# Patient Record
Sex: Female | Born: 1975 | Race: Black or African American | Hispanic: No | Marital: Single | Smoking: Never smoker
Health system: Southern US, Community
[De-identification: ages and names within clinical notes are randomized; demographics above are authoritative.]

## PROBLEM LIST (undated history)

## (undated) DIAGNOSIS — R7303 Prediabetes: Secondary | ICD-10-CM

## (undated) DIAGNOSIS — T8859XA Other complications of anesthesia, initial encounter: Secondary | ICD-10-CM

## (undated) DIAGNOSIS — H9190 Unspecified hearing loss, unspecified ear: Secondary | ICD-10-CM

## (undated) HISTORY — DX: Unspecified hearing loss, unspecified ear: H91.90

## (undated) HISTORY — DX: Prediabetes: R73.03

---

## 1999-08-13 ENCOUNTER — Other Ambulatory Visit: Admission: RE | Admit: 1999-08-13 | Discharge: 1999-08-13 | Payer: Self-pay | Admitting: Obstetrics & Gynecology

## 2001-04-23 ENCOUNTER — Other Ambulatory Visit: Admission: RE | Admit: 2001-04-23 | Discharge: 2001-04-23 | Payer: Self-pay | Admitting: Obstetrics and Gynecology

## 2001-08-24 ENCOUNTER — Ambulatory Visit (HOSPITAL_COMMUNITY): Admission: RE | Admit: 2001-08-24 | Discharge: 2001-08-24 | Payer: Self-pay | Admitting: Obstetrics and Gynecology

## 2001-08-24 ENCOUNTER — Encounter: Payer: Self-pay | Admitting: Obstetrics and Gynecology

## 2001-10-27 ENCOUNTER — Inpatient Hospital Stay (HOSPITAL_COMMUNITY): Admission: AD | Admit: 2001-10-27 | Discharge: 2001-10-30 | Payer: Self-pay | Admitting: *Deleted

## 2002-03-29 ENCOUNTER — Other Ambulatory Visit: Admission: RE | Admit: 2002-03-29 | Discharge: 2002-03-29 | Payer: Self-pay | Admitting: Obstetrics and Gynecology

## 2003-02-21 ENCOUNTER — Ambulatory Visit (HOSPITAL_BASED_OUTPATIENT_CLINIC_OR_DEPARTMENT_OTHER): Admission: RE | Admit: 2003-02-21 | Discharge: 2003-02-21 | Payer: Self-pay | Admitting: Specialist

## 2003-02-21 ENCOUNTER — Encounter (INDEPENDENT_AMBULATORY_CARE_PROVIDER_SITE_OTHER): Payer: Self-pay | Admitting: *Deleted

## 2003-04-12 ENCOUNTER — Other Ambulatory Visit: Admission: RE | Admit: 2003-04-12 | Discharge: 2003-04-12 | Payer: Self-pay | Admitting: Obstetrics and Gynecology

## 2003-05-31 ENCOUNTER — Emergency Department (HOSPITAL_COMMUNITY): Admission: AD | Admit: 2003-05-31 | Discharge: 2003-05-31 | Payer: Self-pay | Admitting: Emergency Medicine

## 2004-09-28 ENCOUNTER — Other Ambulatory Visit: Admission: RE | Admit: 2004-09-28 | Discharge: 2004-09-28 | Payer: Self-pay | Admitting: Obstetrics and Gynecology

## 2005-01-11 ENCOUNTER — Emergency Department (HOSPITAL_COMMUNITY): Admission: EM | Admit: 2005-01-11 | Discharge: 2005-01-11 | Payer: Self-pay | Admitting: Emergency Medicine

## 2005-10-29 ENCOUNTER — Other Ambulatory Visit: Admission: RE | Admit: 2005-10-29 | Discharge: 2005-10-29 | Payer: Self-pay | Admitting: Obstetrics and Gynecology

## 2007-01-28 ENCOUNTER — Ambulatory Visit: Payer: Self-pay | Admitting: Family Medicine

## 2007-01-28 ENCOUNTER — Encounter (INDEPENDENT_AMBULATORY_CARE_PROVIDER_SITE_OTHER): Payer: Self-pay | Admitting: Family Medicine

## 2007-01-28 ENCOUNTER — Other Ambulatory Visit: Admission: RE | Admit: 2007-01-28 | Discharge: 2007-01-28 | Payer: Self-pay | Admitting: Family Medicine

## 2007-01-28 DIAGNOSIS — N898 Other specified noninflammatory disorders of vagina: Secondary | ICD-10-CM | POA: Insufficient documentation

## 2007-01-28 LAB — CONVERTED CEMR LAB
Beta hcg, urine, semiquantitative: NEGATIVE
Chlamydia, DNA Probe: POSITIVE — AB
GC Probe Amp, Genital: NEGATIVE
Whiff Test: POSITIVE

## 2007-01-29 ENCOUNTER — Telehealth: Payer: Self-pay | Admitting: *Deleted

## 2007-02-03 ENCOUNTER — Encounter (INDEPENDENT_AMBULATORY_CARE_PROVIDER_SITE_OTHER): Payer: Self-pay | Admitting: Family Medicine

## 2007-02-03 ENCOUNTER — Ambulatory Visit: Payer: Self-pay | Admitting: Sports Medicine

## 2007-02-03 DIAGNOSIS — Z8619 Personal history of other infectious and parasitic diseases: Secondary | ICD-10-CM

## 2007-02-13 ENCOUNTER — Ambulatory Visit: Payer: Self-pay | Admitting: Family Medicine

## 2007-02-13 ENCOUNTER — Encounter (INDEPENDENT_AMBULATORY_CARE_PROVIDER_SITE_OTHER): Payer: Self-pay | Admitting: Family Medicine

## 2007-02-13 LAB — CONVERTED CEMR LAB
Chlamydia, Swab/Urine, PCR: NEGATIVE
GC Probe Amp, Urine: NEGATIVE

## 2007-02-18 ENCOUNTER — Ambulatory Visit: Payer: Self-pay | Admitting: Family Medicine

## 2007-02-23 ENCOUNTER — Encounter (INDEPENDENT_AMBULATORY_CARE_PROVIDER_SITE_OTHER): Payer: Self-pay | Admitting: Family Medicine

## 2007-04-01 ENCOUNTER — Ambulatory Visit: Payer: Self-pay | Admitting: Obstetrics & Gynecology

## 2007-05-01 ENCOUNTER — Ambulatory Visit: Payer: Self-pay | Admitting: Gynecology

## 2007-10-02 ENCOUNTER — Encounter (INDEPENDENT_AMBULATORY_CARE_PROVIDER_SITE_OTHER): Payer: Self-pay | Admitting: Family Medicine

## 2007-10-02 ENCOUNTER — Ambulatory Visit: Payer: Self-pay | Admitting: Family Medicine

## 2007-10-02 LAB — CONVERTED CEMR LAB: Whiff Test: NEGATIVE

## 2007-10-05 LAB — CONVERTED CEMR LAB
Chlamydia, DNA Probe: NEGATIVE
GC Probe Amp, Genital: NEGATIVE

## 2007-11-02 ENCOUNTER — Ambulatory Visit: Payer: Self-pay | Admitting: Sports Medicine

## 2007-12-02 ENCOUNTER — Encounter (INDEPENDENT_AMBULATORY_CARE_PROVIDER_SITE_OTHER): Payer: Self-pay | Admitting: Family Medicine

## 2007-12-18 ENCOUNTER — Ambulatory Visit: Payer: Self-pay | Admitting: Family Medicine

## 2007-12-18 ENCOUNTER — Encounter: Payer: Self-pay | Admitting: Family Medicine

## 2007-12-18 DIAGNOSIS — N760 Acute vaginitis: Secondary | ICD-10-CM | POA: Insufficient documentation

## 2007-12-18 LAB — CONVERTED CEMR LAB
Chlamydia, DNA Probe: NEGATIVE
GC Probe Amp, Genital: NEGATIVE

## 2008-01-28 ENCOUNTER — Ambulatory Visit: Payer: Self-pay | Admitting: Family Medicine

## 2008-01-28 ENCOUNTER — Telehealth: Payer: Self-pay | Admitting: *Deleted

## 2008-01-28 DIAGNOSIS — R300 Dysuria: Secondary | ICD-10-CM | POA: Insufficient documentation

## 2008-01-28 LAB — CONVERTED CEMR LAB: Casts: 1 /lpf

## 2008-10-12 ENCOUNTER — Telehealth: Payer: Self-pay | Admitting: Family Medicine

## 2008-10-14 ENCOUNTER — Encounter: Payer: Self-pay | Admitting: Family Medicine

## 2008-10-14 ENCOUNTER — Ambulatory Visit: Payer: Self-pay | Admitting: Family Medicine

## 2008-10-14 LAB — CONVERTED CEMR LAB
Beta hcg, urine, semiquantitative: NEGATIVE
Whiff Test: NEGATIVE

## 2008-10-17 ENCOUNTER — Encounter: Payer: Self-pay | Admitting: Family Medicine

## 2008-10-17 LAB — CONVERTED CEMR LAB
Chlamydia, DNA Probe: NEGATIVE
GC Probe Amp, Genital: NEGATIVE

## 2009-02-23 ENCOUNTER — Ambulatory Visit: Payer: Self-pay | Admitting: Family Medicine

## 2009-02-23 ENCOUNTER — Encounter: Payer: Self-pay | Admitting: Family Medicine

## 2009-02-23 ENCOUNTER — Telehealth: Payer: Self-pay | Admitting: Family Medicine

## 2009-02-23 LAB — CONVERTED CEMR LAB
Protein, U semiquant: 30
Urobilinogen, UA: >=8
Whiff Test: NEGATIVE

## 2009-02-24 ENCOUNTER — Telehealth: Payer: Self-pay | Admitting: Family Medicine

## 2009-03-16 ENCOUNTER — Encounter: Payer: Self-pay | Admitting: Family Medicine

## 2009-03-16 ENCOUNTER — Ambulatory Visit: Payer: Self-pay | Admitting: Family Medicine

## 2009-03-16 ENCOUNTER — Telehealth: Payer: Self-pay | Admitting: Family Medicine

## 2009-03-16 DIAGNOSIS — R87610 Atypical squamous cells of undetermined significance on cytologic smear of cervix (ASC-US): Secondary | ICD-10-CM | POA: Insufficient documentation

## 2009-03-16 LAB — CONVERTED CEMR LAB
Blood in Urine, dipstick: NEGATIVE
Chlamydia, DNA Probe: NEGATIVE
GC Probe Amp, Genital: NEGATIVE
Glucose, Urine, Semiquant: NEGATIVE
Nitrite: NEGATIVE
Protein, U semiquant: NEGATIVE
Specific Gravity, Urine: 1.015
WBC Urine, dipstick: NEGATIVE
pH: 7

## 2009-03-17 ENCOUNTER — Encounter: Payer: Self-pay | Admitting: Family Medicine

## 2009-03-20 ENCOUNTER — Telehealth: Payer: Self-pay | Admitting: Family Medicine

## 2009-03-20 ENCOUNTER — Encounter: Payer: Self-pay | Admitting: Family Medicine

## 2009-03-20 ENCOUNTER — Ambulatory Visit: Payer: Self-pay | Admitting: Family Medicine

## 2009-03-20 DIAGNOSIS — N390 Urinary tract infection, site not specified: Secondary | ICD-10-CM

## 2009-03-20 DIAGNOSIS — R319 Hematuria, unspecified: Secondary | ICD-10-CM | POA: Insufficient documentation

## 2009-03-20 LAB — CONVERTED CEMR LAB
BUN: 21 mg/dL (ref 6–23)
CO2: 24 meq/L (ref 19–32)
Calcium: 9.9 mg/dL (ref 8.4–10.5)
Chloride: 106 meq/L (ref 96–112)
Creatinine, Ser: 0.78 mg/dL (ref 0.40–1.20)
Glucose, Bld: 87 mg/dL (ref 70–99)
Potassium: 4.5 meq/L (ref 3.5–5.3)
Sodium: 141 meq/L (ref 135–145)
Specific Gravity, Urine: 1.025
pH: 7

## 2009-03-21 ENCOUNTER — Encounter: Payer: Self-pay | Admitting: Family Medicine

## 2009-03-24 ENCOUNTER — Encounter: Payer: Self-pay | Admitting: Family Medicine

## 2009-06-22 ENCOUNTER — Ambulatory Visit: Payer: Self-pay | Admitting: Family Medicine

## 2009-06-22 ENCOUNTER — Encounter: Payer: Self-pay | Admitting: Family Medicine

## 2009-06-22 DIAGNOSIS — B373 Candidiasis of vulva and vagina: Secondary | ICD-10-CM

## 2009-06-22 LAB — CONVERTED CEMR LAB
Chlamydia, DNA Probe: NEGATIVE
GC Probe Amp, Genital: NEGATIVE
Whiff Test: NEGATIVE

## 2009-06-27 ENCOUNTER — Encounter: Payer: Self-pay | Admitting: Family Medicine

## 2009-09-28 ENCOUNTER — Ambulatory Visit: Payer: Self-pay | Admitting: Family Medicine

## 2009-10-31 ENCOUNTER — Encounter: Payer: Self-pay | Admitting: Family Medicine

## 2009-10-31 ENCOUNTER — Ambulatory Visit: Payer: Self-pay | Admitting: Family Medicine

## 2009-10-31 LAB — CONVERTED CEMR LAB
Bilirubin Urine: NEGATIVE
Nitrite: NEGATIVE
Specific Gravity, Urine: 1.025
Urobilinogen, UA: 1
WBC, UA: >20 cells/hpf
pH: 5.5

## 2009-12-28 ENCOUNTER — Encounter: Payer: Self-pay | Admitting: Family Medicine

## 2009-12-28 ENCOUNTER — Ambulatory Visit: Payer: Self-pay | Admitting: Family Medicine

## 2009-12-28 DIAGNOSIS — J060 Acute laryngopharyngitis: Secondary | ICD-10-CM

## 2009-12-28 DIAGNOSIS — J029 Acute pharyngitis, unspecified: Secondary | ICD-10-CM | POA: Insufficient documentation

## 2010-09-27 NOTE — Assessment & Plan Note (Signed)
Summary: sore throat,df   Vital Signs:  Patient profile:   35 year old female Weight:      149 pounds Temp:     98.2 degrees F oral Pulse rate:   74 / minute BP sitting:   115 / 79  (right arm)  Vitals Entered By: Arlyss Repress CMA, (Dec 28, 2009 10:03 AM) CC: sore throat. exposed to strep. cough and congestion x 2 days. Pain Assessment Patient in pain? no        Primary Care Provider:  Ancil Boozer  MD  CC:  sore throat. exposed to strep. cough and congestion x 2 days.Marland Kitchen  History of Present Illness: CC: ST  3d h/o sore throat, runny nose, feeling chill wand with pressure on left side of nose.  Exposed to strep at church.  Mild HA and neck pain.  Subjective fever this am.  + small cough.  + PND.  no problem w/ allergies, nausea/vomiting, abd pain.  No one sick at home.  No smokers at home.  Theraflu last night made it better.  Habits & Providers  Alcohol-Tobacco-Diet     Tobacco Status: never  Current Medications (verified): 1)  None  Allergies (verified): No Known Drug Allergies  Past History:  Past medical, surgical, family and social histories (including risk factors) reviewed for relevance to current acute and chronic problems.  Past Surgical History: Reviewed history from 10/02/2007 and no changes required. C- section 10/28/06 Mirena IUD-6/08  Family History: Reviewed history from 01/28/2007 and no changes required. Mother-obesity, sleep apnea, HTN, DM II  Social History: Reviewed history from 03/16/2009 and no changes required. Single. Lives with her daughter (3/03) .  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   Never Smoked Alcohol use-no Drug use-no Regular exercise-no Mom passed away unexpectedly 5/10 -- has been hard for pt but dealing well with it and has good support.   Physical Exam  General:  Well-developed,well-nourished,in no acute distress; alert,appropriate and cooperative throughout examination Head:  normocephalic and  atraumatic.   Eyes:  No corneal or conjunctival inflammation noted. EOMI. Perrla.  Ears:  External ear exam shows no significant lesions or deformities.  Otoscopic examination reveals clear canals, tympanic membranes are intact bilaterally without bulging, retraction, inflammation or discharge. Hearing is grossly normal bilaterally. Nose:  slightly swollen turbinates, serous discharge Mouth:  erythematous posterior oropharynx but no tonsilar edema, exudates. Neck:  R AC LAD Lungs:  Normal respiratory effort, chest expands symmetrically. Lungs are clear to auscultation, no crackles or wheezes.  no coughing Heart:  Normal rate and regular rhythm. S1 and S2 normal without gallop, murmur, click, rub or other extra sounds. Abdomen:  Bowel sounds positive,abdomen soft and non-tender without masses, organomegaly or hernias noted. Extremities:  no cyanosis, clubbing, or edema    Impression & Recommendations:  Problem # 1:  ACUTE LARYNGOPHARYNGITIS (ICD-465.0) viral URTI.  supportive care with chloraseptic spray and throat lozenges, NSAIDs for inflammation.  red flags to return Orders: [facility]- Est Level  3 ([address])  Other Orders: Rapid Strep-[facility] ([address])  Patient Instructions: 1)  Looks like it's a viral infection.  You should feel better in next 4 days. 2)  Sucking on hard candy can help, or over the counter chloraseptic spray.  Ibuprofen 600mg  three times a day with food will help your sore throat. 3)  Lots of fluids and rest over next few days.  Return as needed, sooner if fever worsening, trouble opening mouth or drooling, or not improving as expected.  Laboratory Results  Date/Time Received: Dec 28, 2009 10:08 AM  Date/Time Reported: Dec 28, 2009 10:20 AM   Other Tests  Rapid Strep: negative Comments: ...............test performed by......Marland KitchenBonnie A. [place], MLS (ASCP)cm

## 2010-09-27 NOTE — Letter (Signed)
Summary: Out of Work  [facility]  [address]   [place], [place] [address]   Phone: [phone]  Fax: [phone]    Dec 28, 2009   Employee:  KIMMERLY LORA    To Whom It May Concern:   For Medical reasons, please excuse the above named employee from work for the following dates:  Start:  Dec 28, 2009   End:  Dec 29, 2009   If you need additional information, please feel free to contact our office.         Sincerely,    Eustaquio Boyden  MD

## 2010-09-27 NOTE — Assessment & Plan Note (Signed)
Summary: uti?,df   Vital Signs:  Patient profile:   35 year old female Height:      57.5 inches Weight:      148.1 pounds BMI:     31.61 Temp:     98.4 degrees F oral Pulse rate:   87 / minute BP sitting:   132 / 74  (left arm) Cuff size:   regular  Vitals Entered By: Garen Grams LPN (November 01, 3218 10:17 AM) CC: UTI? Is Patient Diabetic? No Pain Assessment Patient in pain? no        Primary Care Provider:  Ancil Boozer  MD  CC:  UTI?Marland Kitchen  History of Present Illness: 1.  ?uti--woke up this morning with dysuria, frequency and urgency.  had one episode of incontinence.  has had utis before.  no abd pain, fever, n/v.  does have some white vaginal discharge, but no vaginal itching.    Habits & Providers  Alcohol-Tobacco-Diet     Tobacco Status: never  Current Medications (verified): 1)  Pyridium 100 Mg Tabs (Phenazopyridine Hcl) .Marland Kitchen.. 1 Tab By Mouth Three Times A Day X 2 Days After Meals. 2)  Diflucan 150 Mg Tabs (Fluconazole) .... One By Mouth X 1; Repeat in 72 Hours  Allergies: No Known Drug Allergies  Review of Systems General:  Denies fever, loss of appetite, and malaise. GI:  Denies abdominal pain, nausea, and vomiting. GU:  Complains of dysuria, incontinence, and urinary frequency; denies abnormal vaginal bleeding.  Physical Exam  General:  Well-developed,well-nourished,in no acute distress; alert,appropriate and cooperative throughout examination Abdomen:  Bowel sounds positive,abdomen soft and non-tender without masses, organomegaly or hernias noted. Additional Exam:  vital signs reviewed    Impression & Recommendations:  Problem # 1:  DYSURIA (ICD-788.1) symptoms and u/a  consistent with uti.   (not having period at this time).  treat empirically.  send for culture.  pyridium for dysuria.  gave script for yeast infection to use if she needs because she is prone to have them. The following medications were removed from the medication list:    Doxycycline  Hyclate 100 Mg Caps (Doxycycline hyclate) .Marland KitchenMarland KitchenMarland KitchenMarland Kitchen 100 mg twice a day x 7 days Her updated medication list for this problem includes:    Pyridium 100 Mg Tabs (Phenazopyridine hcl) .Marland Kitchen... 1 tab by mouth three times a day x 2 days after meals.    Keflex 500 Mg Caps (Cephalexin) .Marland Kitchen... 1 tab by mouth three times a day for 5 days  Orders: Urinalysis-[facility] (00000) [facility]- Est Level  3 ([address]) Urine Culture-[facility] ([id number])  Complete Medication List: 1)  Pyridium 100 Mg Tabs (Phenazopyridine hcl) .Marland Kitchen.. 1 tab by mouth three times a day x 2 days after meals. 2)  Diflucan 150 Mg Tabs (Fluconazole) .... One by mouth x 1; repeat in 72 hours 3)  Keflex 500 Mg Caps (Cephalexin) .Marland Kitchen.. 1 tab by mouth three times a day for 5 days  Patient Instructions: 1)  It was nice to see you today. 2)  I think you have a urinary tract infection.   3)  Take the antibiotics (cephalexin) 4)  You can take pyridium for burning when you urinate for up to 2 days. 5)  If you feel like you are getting a yeast infection, you can take the diflucan (fluconazole). 6)  If you get a fever, abdominal pain, start vomitting, come back immediately. Prescriptions: DIFLUCAN 150 MG TABS (FLUCONAZOLE) one by mouth x 1; repeat in 72 hours  #2 x 0  Entered and Authorized by:   Asher Muir MD   Signed by:   Asher Muir MD on 10/31/2009   Method used:   Electronically to        [facility] [phone]* (retail)       [address]       [place], [place]  [address]       Ph: [phone]       Fax: [phone]   RxID:   [id number] PYRIDIUM 100 MG TABS (PHENAZOPYRIDINE HCL) 1 tab by mouth three times a day x 2 days after meals.  #6 x 0   Entered and Authorized by:   Asher Muir MD   Signed by:   Asher Muir MD on 10/31/2009   Method used:   Electronically to        [facility] [phone]* (retail)       [address]       [place], [place]  [address]       Ph: [phone]       Fax: [phone]   RxID:    [id number] KEFLEX 500 MG CAPS (CEPHALEXIN) 1 tab by mouth three times a day for 5 days  #10 x 0   Entered and Authorized by:   Asher Muir MD   Signed by:   Asher Muir MD on 10/31/2009   Method used:   Electronically to        [facility] [phone]* (retail)       [address]       [place], [place]  [address]       Ph: [phone]       Fax: [phone]   RxID:   [id number]   Laboratory Results   Urine Tests  Date/Time Received: October 31, 2009 10:18 AM  Date/Time Reported: October 31, 2009 10:44 AM   Routine Urinalysis   Color: yellow Appearance: Clear Glucose: negative   (Normal Range: Negative) Bilirubin: negative   (Normal Range: Negative) Ketone: negative   (Normal Range: Negative) Spec. Gravity: 1.025   (Normal Range: 1.003-1.035) Blood: moderate   (Normal Range: Negative) pH: 5.5   (Normal Range: 5.0-8.0) Protein: trace   (Normal Range: Negative) Urobilinogen: 1.0   (Normal Range: 0-1) Nitrite: negative   (Normal Range: Negative) Leukocyte Esterace: small   (Normal Range: Negative)  Urine Microscopic WBC/HPF: >20 RBC/HPF: 10-15 Bacteria/HPF: 1+ Mucous/HPF: 1+ Epithelial/HPF: 1-5    Comments: ...........test performed by...........Marland KitchenTerese Door, CMA

## 2010-09-27 NOTE — Assessment & Plan Note (Signed)
Summary: cough,tcb   Vital Signs:  Patient profile:   35 year old female Height:      57.5 inches Weight:      145.5 pounds BMI:     31.05 Temp:     98.2 degrees F oral Pulse rate:   94 / minute BP sitting:   108 / 68  (left arm) Cuff size:   regular  Vitals Entered By: Gladstone Pih (September 28, 2009 3:46 PM) CC: C/O cough,fever since Sunday Is Patient Diabetic? No Pain Assessment Patient in pain? no        Primary Care Provider:  Ancil Boozer  MD  CC:  C/O cough and fever since Sunday.  History of Present Illness: 5 day history of cold symptoms.  At onset started with a cough.  Now with fever, 102, chills.  No fever in past day.  Continues to have cough, brown mucous.  Sore throat started today.  Endorses fatigue, no myalgias currently, but did at onset of illness.  Daughter was ill with similar illness but is now better.  No emesis, diarrhea.    Has taken many OTC meds such a as nyquil, dayquil, theraflu, delsymm with little help.  Habits & Providers  Alcohol-Tobacco-Diet     Tobacco Status: never  Current Medications (verified): 1)  Metronidazole 0.75 % Vag Gel (Metronidazole) .... One Applicator Ful At Bedtime X3 Nights, 1 Tube 2)  Pyridium 100 Mg Tabs (Phenazopyridine Hcl) .Marland Kitchen.. 1 Tab By Mouth Three Times A Day X 2 Days After Meals. 3)  Diflucan 150 Mg Tabs (Fluconazole) .... One By Mouth X 1; Repeat in 72 Hours 4)  Doxycycline Hyclate 100 Mg Caps (Doxycycline Hyclate) .Marland Kitchen.. 100 Mg Twice A Day X 7 Days 5)  Tussionex Pennkinetic Er 8-10 Mg/89ml Lqcr (Chlorpheniramine-Hydrocodone) .... 5 Ml Every 12 Hours As Needed For Cough  Allergies: No Known Drug Allergies PMH-FH-SH reviewed-no changes except otherwise noted  Review of Systems      See HPI General:  Complains of chills, fatigue, fever, malaise, and weakness. CV:  Denies chest pain or discomfort, lightheadness, and near fainting. Resp:  Complains of cough and sputum productive. GI:  Denies abdominal pain,  diarrhea, nausea, and vomiting.  Physical Exam  General:  alert, well-developed, and well-nourished.  NAD. Ears:  narrow, tortuous canals, difficult to fully auscultate. Mouth:  erythematous posterior oropharynx but no tonsilar edema, erythema. Neck:  no LAD Lungs:  rhonchi in right middle lobe with decreased breath sounds.  No qheezes.  Coughing a lot during exam. Heart:  Normal rate and regular rhythm. S1 and S2 normal without gallop, murmur, click, rub or other extra sounds.   Impression & Recommendations:  Problem # 1:  COUGH (ICD-786.2)  Possible bronchitis/pneumonia.  Outside window for effective flu treatment with 5 days of symptoms.  Healthy at baseline, non-smoker, not high risk.  Will give 7 day course of docycycline given increasing resistance to azithromycin in the community.  Will also prescribe tussionex- patient given precuation on sleepiness.  Patient requested prescription for diflucan as she frequently gets yeast infections when taking antibiotics.    Orders: [facility]- Est Level  3 ([address])  Complete Medication List: 1)  Metronidazole 0.75 % Vag Gel (Metronidazole) .... One applicator ful at bedtime x3 nights, 1 tube 2)  Pyridium 100 Mg Tabs (Phenazopyridine hcl) .Marland Kitchen.. 1 tab by mouth three times a day x 2 days after meals. 3)  Diflucan 150 Mg Tabs (Fluconazole) .... One by mouth x 1; repeat in 72  hours 4)  Doxycycline Hyclate 100 Mg Caps (Doxycycline hyclate) .Marland Kitchen.. 100 mg twice a day x 7 days 5)  Tussionex Pennkinetic Er 8-10 Mg/75ml Lqcr (Chlorpheniramine-hydrocodone) .... 5 ml every 12 hours as needed for cough  Patient Instructions: 1)  Take full course of antibiotics, even if you start to feel better. 2)  Try tussionex cough syrup- it may make you sleepy. 3)  Call office for appt if you are not starting to feel better in 2-3 days. Prescriptions: TUSSIONEX PENNKINETIC ER 8-10 MG/5ML LQCR (CHLORPHENIRAMINE-HYDROCODONE) 5 ml every 12 hours as needed for cough  #18ml x 0    Entered and Authorized by:   Delbert Harness MD   Signed by:   Delbert Harness MD on 09/28/2009   Method used:   Print then Give to Patient   RxID:   [id number] DIFLUCAN 150 MG TABS (FLUCONAZOLE) one by mouth x 1; repeat in 72 hours  #2 x 0   Entered and Authorized by:   Delbert Harness MD   Signed by:   Delbert Harness MD on 09/28/2009   Method used:   Electronically to        [facility] [phone]* (retail)       [address]       [place], [place]  [address]       Ph: [phone]       Fax: [phone]   RxID:   [id number] DOXYCYCLINE HYCLATE 100 MG CAPS (DOXYCYCLINE HYCLATE) 100 mg twice a day x 7 days  #14 x 0   Entered and Authorized by:   Delbert Harness MD   Signed by:   Delbert Harness MD on 09/28/2009   Method used:   Electronically to        [facility] [phone]* (retail)       [address]       [place], [place]  [address]       Ph: [phone]       Fax: [phone]   RxID:   [id number]

## 2010-09-27 NOTE — Assessment & Plan Note (Signed)
Summary: blood with urination/[place]   Vital Signs:  Patient profile:   35 year old female Height:      57.5 inches Weight:      131.44 pounds BMI:     28.05 Temp:     98.2 degrees F oral Pulse rate:   56 / minute Pulse rhythm:   regular BP sitting:   104 / 67  (left arm)  Vitals Entered By: Modesta Messing LPN (March 20, 2009 9:50 AM)  CC: Hematuria Is Patient Diabetic? No Pain Assessment Patient in pain? no        Primary Care Provider:  Ancil Boozer  MD  CC:  Hematuria.  History of Present Illness: [name] is a 35 year old female presenting with c/o dysuria, hematuria, suprapubic pain and frequency. She was Dx with UTI 02/23/09 and treated with Keflex. She states that she has the same s/s as that visit. She denies fever/chills, back pain, N/V/D, vaginal discharge. She was also recently tested for STDs which were all negative.  Current Medications (verified): 1)  Metronidazole 0.75 % Vag Gel (Metronidazole) .... One Applicator Ful At Bedtime X3 Nights, 1 Tube 2)  Pyridium 100 Mg Tabs (Phenazopyridine Hcl) .Marland Kitchen.. 1 Tab By Mouth Three Times A Day X 2 Days After Meals. 3)  Diflucan 150 Mg Tabs (Fluconazole) .... One By Mouth X 1; Repeat in 72 Hours 4)  Cipro 500 Mg Tabs (Ciprofloxacin Hcl) .Marland Kitchen.. 1 By Mouth 2 Times Daily  Allergies (verified): No Known Drug Allergies  Review of Systems       per HPI, otherwise negative  Physical Exam  General:  Well-developed,well-nourished,in no acute distress; alert,appropriate and cooperative throughout examination. vitals reviewed. Abdomen:  Bowel sounds positive,abdomen soft and non-tender without masses, organomegaly or hernias noted. Psych:  Oriented X3, memory intact for recent and remote, normally interactive, good eye contact, and not anxious appearing.     Impression & Recommendations:  Problem # 1:  UTI (ICD-599.0) Assessment New  Will treat with Cipro. Sending urine for culture. Advised patient to follow up if not  improving in 3 days. Gave RED FLAGS that would prompt seeking medical care. Educated re: prevention of UTIs. Questions answered. Would follow up with UA to check for hematuria once UTI resolved.  The following medications were removed from the medication list:    Keflex 500 Mg Caps (Cephalexin) .Marland Kitchen... Three times a day for 3 days Her updated medication list for this problem includes:    Pyridium 100 Mg Tabs (Phenazopyridine hcl) .Marland Kitchen... 1 tab by mouth three times a day x 2 days after meals.    Cipro 500 Mg Tabs (Ciprofloxacin hcl) .Marland Kitchen... 1 by mouth 2 times daily  Orders: [facility]- Est Level  3 ([address])  Complete Medication List: 1)  Metronidazole 0.75 % Vag Gel (Metronidazole) .... One applicator ful at bedtime x3 nights, 1 tube 2)  Pyridium 100 Mg Tabs (Phenazopyridine hcl) .Marland Kitchen.. 1 tab by mouth three times a day x 2 days after meals. 3)  Diflucan 150 Mg Tabs (Fluconazole) .... One by mouth x 1; repeat in 72 hours 4)  Cipro 500 Mg Tabs (Ciprofloxacin hcl) .Marland Kitchen.. 1 by mouth 2 times daily  Other Orders: Urinalysis-[facility] (00000) Basic Met-[facility] ([id number]) Urine Culture-[facility] ([id number])  Patient Instructions: 1)  You do have a urinary tract infection.  I will culture your urine and call you if we need to change your antibiotics. 2)  Please come to see [place] if you are not better in 3 days.  Prescriptions: CIPRO 500 MG TABS (CIPROFLOXACIN HCL) 1 by mouth 2 times daily  #20 x 0   Entered and Authorized by:   Helane Rima MD   Signed by:   Helane Rima MD on 03/20/2009   Method used:   Electronically to        [facility] [phone]* (retail)       [address]       [place], [place]  [address]       Ph: [phone]       Fax: [phone]   RxID:   [id number]   Laboratory Results   Urine Tests  Date/Time Received: March 20, 2009 10:03 AM  Date/Time Reported: March 20, 2009 10:17 AM March 20, 2009 11:20 AM   Routine Urinalysis   Color: yellow Appearance: Cloudy Spec. Gravity:  1.025   (Normal Range: 1.003-1.035) Blood: large   (Normal Range: Negative) pH: 7.0   (Normal Range: 5.0-8.0) Leukocyte Esterace: moderate   (Normal Range: Negative)  Urine Microscopic WBC/HPF: loaded RBC/HPF: loaded Bacteria/HPF: 2+ Epithelial/HPF: 1-5    Comments: pt taking AZO, urine discolored and unable to read entire dipstick, 1 cc sample, urine cultured  ...............test performed by......Marland KitchenBonnie A. [place], [place] (ASCP)

## 2011-01-08 NOTE — Group Therapy Note (Signed)
[name], [name] NO.:  [id number]   MEDICAL RECORD NO.:  [id number]          PATIENT TYPE:  WOC   LOCATION:  [facility]                   FACILITY:  [facility]   PHYSICIAN:  Ginger Carne, MD DATE OF BIRTH:  21-Dec-1975   DATE OF SERVICE:                                  CLINIC NOTE   The patient is here to follow up on her Mirena IUD placement and to  check to see if it is in place.  Also, she is here for an STD check.  She is complaining of some white discharge.  No itching, burning, or  odor.  She states that this happens off and on.  She did have chlamydia  prior to getting her IUD placed, but the testing was negative at the  time of IUD placement.  She would like gonorrhea, chlamydia, and tests  done for bacterial vaginosis today.  She does not want HIV, RPR,  hepatitis, or other blood testing for STDs.  She has 1 partner.  She has  not had any problems with the IUD.   VITAL SIGNS:  Temperature 97.9, pulse 93, blood pressure 113/74, weight  139.9 pounds.   LAST MENSTRUAL PERIOD:  March 31, 2007.   LAST PAP SMEAR:  June 2008.   EXAM:  The patient has no lesions on her labia or vaginal introitus.  Her vaginal canal and cervix were normal in appearance, except for some  thick white discharge that was removed easily with a swab.  The IUD  string was in place.   ASSESSMENT AND PLAN:  The patient is a 35 year old African-American  female here for intrauterine device check and sexually transmitted  disease testing.  1. Intrauterine device check, intrauterine device is in place.  The      patient is without problems.  Will continue current birth control      method.  2. Sexually transmitted disease testing.  The patient did have GC and      chlamydia as well as wet prep testing today.  Wet prep was examined      under the microscope and appeared to be positive for multiple clue      cells.  The patient was given a prescription for metronidazole 500  mg 1 tablet p.o. b.i.d. x7 days and also she was advised to use      condoms if she notes these symptoms occurring again, because this      could stop the chain of events due to alkaline pH of sperm.  The      patient will be called with any abnormal results.           ______________________________  Ginger Carne, MD     SHB/MEDQ  D:  05/01/2007  T:  05/01/2007  Job:  [id number]

## 2011-01-11 NOTE — Discharge Summary (Signed)
[facility]  Patient:    [name], [name] Visit Number: [id number] MRN: [id number]          Service Type: OBS Location: 910A 9113 01 Attending Physician:  Leonard Schwartz Dictated by:   Wynelle Bourgeois, CNM Admit Date:  10/27/2001 Discharge Date: 10/30/2001                             Discharge Summary  ADMISSION DIAGNOSES:          1. Intrauterine pregnancy at term.                               2. Spontaneous rupture of membranes with                                  active labor.                               3. Active herpes simplex virus lesion.  DISCHARGE DIAGNOSES:          1. Intrauterine pregnancy at term.                               2. Spontaneous rupture of membranes with                                  active labor.                               3. Active herpes simplex virus lesion.                               4. Status post primary low transverse cesarean                                  section of a viable female infant weighing                                  8 pounds 14 ounces, Apgars 8 and 9.  HOSPITAL PROCEDURES:          1. Spinal anesthesia.                               2. Primary low transverse cesarean section.  HOSPITAL COURSE:              The patient was admitted for spontaneous rupture of membranes in active labor and experiencing a current HSV outbreak and therefore was elected to proceed with primary low transverse cesarean section. She did well. On postoperative day #1, her hemoglobin was 9.6. Her physical exam was within normal limits and her incision was clean and dry. She received routine postoperative care.  On postoperative days #1, #2, and #3: On postoperative day #3 her vital signs were stable. She was afebrile. Lungs were clear. Heart  rate regular rate and rhythm. Abdomen was slightly distended but she was passing flatus. Incision with staples was cleaned and dry. Lochia was small.  Extremities were within normal limits and she was deemed to have received the full benefit of her hospital stay. She was discharged home on postoperative day #3.  DISCHARGE LABORATORIES:       WBC 13.4, hemoglobin 9.6, hematocrit 29.1, platelets 234,000. RPR nonreactive.  DISCHARGE MEDICATIONS:        1. Tylox one to two p.o. q.3-4h. p.r.n.                               2. Motrin 600 mg p.o. q.6h. p.r.n.  DISCHARGE INSTRUCTIONS:       Discharge instructions per [facility] handout.  DISCHARGE FOLLOWUP:           The patient is to follow up in six weeks or p.r.n. Dictated by:   Wynelle Bourgeois, CNM Attending Physician:  Leonard Schwartz DD:  10/30/01 TD:  11/01/01 Job: [id number]

## 2011-01-11 NOTE — H&P (Signed)
[facility]  Patient:    [name] Visit Number: [id number] MRN: [id number]          Service Type: OBS Location: 910B 9198 01 Attending Physician:  Leonard Schwartz Dictated by:   Wynelle Bourgeois, CNM Admit Date:  10/27/2001                           History and Physical  HISTORY:                      This is a 35 year old G1, P0 at 58 1/7 weeks who presents with leaking fluid since 12:00 today with regular uterine contractions.  Pregnancy has been complicated by a current HSV outbreak, history of pyelonephritis, history of irregular cycles, and small stature. Therefore, a cesarean section is planned due to the current outbreak.  PAST OBSTETRICAL HISTORY:     Patient is a primigravida.  PRENATAL LABORATORIES:        Hemoglobin 11.9, platelets 340,000.  Blood type B+.  Antibody screen negative.  Sickle cell negative.  RPR nonreactive. Rubella immune.  HBSAG negative.  HIV negative.  Pap test normal.  Gonorrhea and chlamydia both negative.  AFP free beta within normal limits.  Glucose challenge within normal limits.  PAST MEDICAL HISTORY:         Remarkable for a history of chlamydia eight years ago, history of HSV since 1996, history of Trichomonas and occasional yeast infection.  She also has a history of chronic constipation.  FAMILY HISTORY:               Remarkable for a mother with hypertension, anemia, and diabetes.  Maternal grandmother with stroke.  Paternal grandmother with cancer.  PAST SURGICAL HISTORY:        Remarkable for wisdom teeth extraction.  GENETIC HISTORY:              Remarkable for a maternal first cousin with mild mental retardation.  SOCIAL HISTORY:               Patient is single, but involved with Ron Parker who is involved and supportive.  She is of the [employer] faith.  She denies any alcohol, tobacco, or drug use.  She works as a Industrial/product designer.  PHYSICAL  EXAMINATION  VITAL SIGNS:                  Stable, afebrile.  HEENT:                        Within normal limits.  NECK:                         Thyroid:  Normal, not enlarged.  BREASTS:                      Soft, nontender.  No masses.  CHEST:                        Clear to auscultation bilaterally.  HEART:                        Regular rate and rhythm.  ABDOMEN:                      Gravid at  40 cm, vertex to Leop[place].  EFM shows a reactive fetal heart rate with uterine contractions every two minutes.  PELVIC:                       Sterile speculum examination shows membranes protruding at the introitus with positive rupture of membranes of clear fluid. Cervical examination 1 cm, 100% effaced, -1 station with a vertex presentation.  There is a single perineal HSV lesion noted.  EXTREMITIES:                  Within normal limits.  ASSESSMENT:                   1. Intrauterine pregnancy at term.                               2. Spontaneous rupture of membranes.                               3. Active labor.                               4. Active herpes simplex virus lesion.  PLAN:                         1. Admit to operating room after consult with                                  Dr. Stefano Gaul.                               2. Proceed with elective low transverse                                  cesarean section. Dictated by:   Wynelle Bourgeois, CNM Attending Physician:  Leonard Schwartz DD:  10/27/01 TD:  10/27/01 Job: [id number]

## 2011-01-11 NOTE — Op Note (Signed)
[facility]  Patient:    [name], [name] Visit Number: [id number] MRN: [id number]          Service Type: OBS Location: 910A 9113 01 Attending Physician:  Leonard Schwartz Dictated by:   Janine Limbo, M.D. Proc. Date: 10/27/01 Admit Date:  10/27/2001                             Operative Report  PREOPERATIVE DIAGNOSES:       1. Term intrauterine pregnancy.                               2. Herpes virus outbreak.                               3. Active labor.  POSTOPERATIVE DIAGNOSES:      1. Term intrauterine pregnancy.                               2. Herpes virus outbreak.                               3. Active labor.  OPERATION:                    Primary low transverse cesarean section.  SURGEON:                      Janine Limbo, M.D.  FIRST ASSISTANT:              Wynelle Bourgeois, CNM.  ANESTHESIA:                   Spinal.  DISPOSITION:                  The patient is a 35 year old female, gravida 1, para 0, who presents at term with early labor.  She has a known herpes lesion. The patient understands the indications for her surgical procedure and she accepts the risks of, but not limited to anesthetic complications, bleeding, infection, and possible damage to the surrounding organs.  FINDINGS:                     An 8 pound and 14 ounce female infant was delivered without difficulty.  The Apgars were 8 at one minute and 9 at five minutes.  The fallopian tubes, ovaries, and the uterus were normal for the gravid state.  DESCRIPTION OF PROCEDURE:     The patient was taken to the operating room where a spinal anesthetic was given.  The patients abdomen and perineum were prepped with multiple layers of Betadine.  A Foley catheter was placed in the bladder.  The patient was sterilely draped.  A low transverse incision was made in the abdomen and carried sharply through the subcutaneous tissue, the fascia, and the  anterior peritoneum.  An incision was made in the lower uterine segment and extended transversely.  The fetal head was delivered without difficulty.  The mouth and nose were suctioned.  The remainder of the infant was delivered.  The cord was clamped and cut, and the infant was handed to the awaiting pediatric team.  Routine cord  blood studies were obtained. The placenta was removed.  The uterine cavity was cleaned of amniotic fluid, clotted blood, and membranes.  The uterine incision was closed using a running locking suture of 2-0 Vicryl.  Figure-of-eight sutures of 2-0 Vicryl were used for hemostasis.  The pericolic gutters were cleaned of amniotic fluid and clotted blood.  The pelvis was vigorously irrigated.  Hemostasis was adequate throughout.  The anterior peritoneum and the abdominal musculature were reapproximated in the midline using 0 Vicryl.  The abdominal musculature and the fascia were irrigated.  The fascia was closed using a running suture of 0 Vicryl followed by interrupted sutures of 0 Vicryl.  The subcutaneous layer was irrigated.  Hemostasis was adequate.  The subcutaneous layer was closed using interrupted sutures of 2-0 Vicryl.  The skin was reapproximated using skin staples.  Sponge, needle, and instrument counts were correct on two occasions.  The estimated blood loss was 700 cc.  The patient tolerated the procedure well. The infant was taken to the full-term nursery in stable condition.  The mother was taken to the recovery room in stable condition. Dictated by:   Janine Limbo, M.D. Attending Physician:  Leonard Schwartz DD:  10/28/01 TD:  10/28/01 Job: [id number]

## 2011-01-11 NOTE — Op Note (Signed)
   NAMETEDI, HUGHSON                         ACCOUNT NO.:  [id number]   MEDICAL RECORD NO.:  [id number]                   PATIENT TYPE:  AMB   LOCATION:  DSC                                  FACILITY:  [facility]   PHYSICIAN:  Earvin Hansen L. Shon Hough, M.D.           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  We tried  medications including steroid injections, topical steroids, etc., to no  avail.   OPERATION/PROCEDURE:  Plastic reconstruction of the area.   SURGEON:  Yaakov Guthrie. Shon Hough, M.D.   ASSISTANT:  Alethia Berthold, CFA,O-PAC   ANESTHESIA:  General.   DESCRIPTION OF PROCEDURE:  The patient underwent general anesthesia,  intubated orally.  Prep was done to the chest, belly areas and groin with  Betadine soap and solution.  Walled with sterile towels and drapes so as to  make a sterile field.  Marking pen was used to outline the lesion.  The area  was injected with Xylocaine 1/3% with 1:300,000 concentration, a total of 50  mL.  Wide excision was made over the mass down to underlying subcutaneous  tissue.  Using a Bovie for electrocoagulation, the keloid was removed  entirely.  Next, the superior and inferior flaps were freed up and then  reconstruction was done with rotational flap coverage.  A 2-0 Monocryl times  two layers in the subcutaneous plane, a subdermal suture of 5-0 Monocryl and  a running subcuticular stitch of 5-0 Monocryl.  Steri-Strips and soft  dressing were applied including silicone gel patches to hopefully prevent  recurrence.  She understands there is a chance for that.  Sterile dressings  were applied.  She withstood the procedures very well and was taken to  recovery in good condition.             Yaakov Guthrie. Shon Hough, M.D.    Cathie Hoops  D:  02/21/2003  T:  02/21/2003  Job:  [id number]

## 2011-01-11 NOTE — Op Note (Signed)
   [name], [name]                         ACCOUNT NO.:  [id number]   MEDICAL RECORD NO.:  [id number]                   PATIENT TYPE:  AMB   LOCATION:  DSC                                  FACILITY:  [facility]   PHYSICIAN:  Earvin Hansen L. Shon Hough, M.D.           DATE OF BIRTH:  05-10-1976   DATE OF PROCEDURE:  02/21/2003  DATE OF DISCHARGE:  02/21/2003                                 OPERATIVE REPORT   A patient with severe keloid formation involving her lower abdomen with  previous surgery and disruption of abdomen.   PROCEDURES:  Wide excision of area and vascular reconstruction.   ANESTHESIA:  General.   DESCRIPTION OF PROCEDURE:  The patient underwent general anesthesia and was  intubated orally.  Prep was done to the chest, breast area, and abdomen  using Betadine soap and solution and walled off with sterile towels and  draped so as to make a sterile field.  Xylocaine 0.5% with epinephrine was  injected locally, a total of 50 mL to the area.  Wide excision was done down  to underlying fascia.  After this, the lateral and medial edges were freed  up about 2.5 cm on each side.  Next reconstruction was done with multiple  sutures of 2-0 Vicryl x 2 layers, then a subdermal suture of 5-0 Vicryl and  then a running subcuticular stitch of 5-0 Vicryl.  Sterile dressings were  applied with Steri-Strips, soft dressing, and Hypafix tape.  She withstood  the procedure very well and was taken to recovery in excellent condition.                                               Yaakov Guthrie. Shon Hough, M.D.    Cathie Hoops  D:  03/25/2003  T:  03/25/2003  Job:  [id number]

## 2011-01-14 ENCOUNTER — Ambulatory Visit (INDEPENDENT_AMBULATORY_CARE_PROVIDER_SITE_OTHER): Payer: Self-pay | Admitting: Family Medicine

## 2011-01-14 VITALS — BP 105/73 | HR 66 | Temp 97.1°F | Ht <= 58 in | Wt 153.0 lb

## 2011-01-14 DIAGNOSIS — B3731 Acute candidiasis of vulva and vagina: Secondary | ICD-10-CM | POA: Insufficient documentation

## 2011-01-14 DIAGNOSIS — B373 Candidiasis of vulva and vagina: Secondary | ICD-10-CM | POA: Insufficient documentation

## 2011-01-14 DIAGNOSIS — N760 Acute vaginitis: Secondary | ICD-10-CM

## 2011-01-14 LAB — POCT WET PREP (WET MOUNT)
Clue Cells Wet Prep HPF POC: NEGATIVE
Yeast Wet Prep HPF POC: NEGATIVE

## 2011-01-14 MED ORDER — METRONIDAZOLE 0.75 % VA GEL: 1.0000 | Freq: Two times a day (BID) | VAGINAL | Status: AC

## 2011-01-14 NOTE — Progress Notes (Signed)
Vaginitis: Pt has been having some d/c for the last 1 week. She has recently switched to a new soap. She has a h/o BV.  No itching, no odor, no new sexual partners. No urinary burning or frequency.   ROs: neg for fevers. Neg except as noted above.   PE:  Gen: sitting comfortably on the table.  GU: pt has minimal discharge, ns signs of blood or irritation. IUD strings in place.

## 2011-01-14 NOTE — Assessment & Plan Note (Signed)
PT has been having some d/c for the last 1 week. She has recently switched to a new soap. She has a h/o BV.  No itching, no odor, no new sexual partners. No urinary burning or frequency.   Plan to have the patient use baking soda baths (they have worked for her before) and if no improvement use Metrogel.

## 2011-01-14 NOTE — Patient Instructions (Signed)
Do the baking soda baths for a couple of days.  If your symptoms do not resolve then take the metrogel.

## 2011-01-15 ENCOUNTER — Telehealth: Payer: Self-pay | Admitting: *Deleted

## 2011-01-15 LAB — GC/CHLAMYDIA PROBE AMP, GENITAL: GC Probe Amp, Genital: NEGATIVE

## 2011-01-15 NOTE — Telephone Encounter (Signed)
LVM for patient to call back to inform of below results.[name]

## 2011-01-15 NOTE — Telephone Encounter (Signed)
Message copied by Jimmy Footman on Tue Jan 15, 2011  2:58 PM ------      Message from: Jamie Brookes      Created: Tue Jan 15, 2011  2:15 PM      Regarding: Please call this patient       I called her yesterday to let her know about the Wet prep. Do you mind calling her to let her know that her GC/Chlam were neg. Thanks            ----- Message -----         From: Bonnie Swaziland         Sent: 01/14/2011  11:37 AM           To: Jamie Brookes

## 2011-01-15 NOTE — Telephone Encounter (Signed)
Patient returned call & was informed of neg results.

## 2011-10-31 ENCOUNTER — Ambulatory Visit (INDEPENDENT_AMBULATORY_CARE_PROVIDER_SITE_OTHER): Payer: Self-pay | Admitting: Family Medicine

## 2011-10-31 ENCOUNTER — Ambulatory Visit: Payer: Self-pay

## 2011-10-31 ENCOUNTER — Other Ambulatory Visit (HOSPITAL_COMMUNITY)
Admission: RE | Admit: 2011-10-31 | Discharge: 2011-10-31 | Disposition: A | Discharge status: Home / Self Care | Payer: Self-pay | Source: Ambulatory Visit | Attending: Family Medicine | Admitting: Family Medicine

## 2011-10-31 VITALS — BP 123/78 | HR 94 | Temp 98.6°F | Ht <= 58 in | Wt 154.0 lb

## 2011-10-31 DIAGNOSIS — Z124 Encounter for screening for malignant neoplasm of cervix: Secondary | ICD-10-CM

## 2011-10-31 DIAGNOSIS — N760 Acute vaginitis: Secondary | ICD-10-CM

## 2011-10-31 DIAGNOSIS — R87610 Atypical squamous cells of undetermined significance on cytologic smear of cervix (ASC-US): Secondary | ICD-10-CM

## 2011-10-31 DIAGNOSIS — E669 Obesity, unspecified: Secondary | ICD-10-CM | POA: Insufficient documentation

## 2011-10-31 DIAGNOSIS — Z01419 Encounter for gynecological examination (general) (routine) without abnormal findings: Secondary | ICD-10-CM | POA: Insufficient documentation

## 2011-10-31 DIAGNOSIS — Z8619 Personal history of other infectious and parasitic diseases: Secondary | ICD-10-CM

## 2011-10-31 DIAGNOSIS — B373 Candidiasis of vulva and vagina: Secondary | ICD-10-CM

## 2011-10-31 LAB — POCT WET PREP (WET MOUNT): Clue Cells Wet Prep Whiff POC: POSITIVE

## 2011-10-31 MED ORDER — METRONIDAZOLE 500 MG PO TABS: 500.0000 mg | ORAL_TABLET | Freq: Two times a day (BID) | ORAL | Status: AC

## 2011-10-31 MED ORDER — FLUCONAZOLE 150 MG PO TABS: 150.0000 mg | ORAL_TABLET | Freq: Once | ORAL | Status: AC

## 2011-10-31 NOTE — Progress Notes (Signed)
  Subjective:    Patient ID: [name], female    DOB: 1976-05-17, 36 y.o.   MRN: [id number]  HPI Patient is a 36 year old female with a history of chlamydia presenting with vaginal discharge.   Discharge for 2 days. Cottage cheese consistency associated with fishy odor and itching. No polyuria/dysuria. Patient says has not had period in last year with IUD inserted. 2 sexual female partners in last 3 months-1 new. Does not regularly use condoms. IUD for birth control.   Health Maintenance  1. Also history ASCUS 2010 and has not had repeat Pap smear.  2. Vaccine-Refuses flu shot. Thinks she had TDAP in [place] in 2008 at [facility].   Review of Systems negative except as noted in HPI     Objective:   Physical Exam  Constitutional: She is oriented to person, place, and time. She appears well-developed and well-nourished. No distress.  Cardiovascular: Normal rate and regular rhythm.  Exam reveals no gallop and no friction rub.   No murmur heard. Pulmonary/Chest: Effort normal and breath sounds normal. No respiratory distress. She has no wheezes.  Abdominal: Soft. Bowel sounds are normal. She exhibits no distension and no mass. There is no tenderness.  Genitourinary: Uterus normal. There is no rash, tenderness or lesion on the right labia. There is no rash, tenderness or lesion on the left labia. Cervix exhibits no motion tenderness. Discharge: patient noted to have IUD string in place. Right adnexum displays no mass, no tenderness and no fullness. Left adnexum displays no mass, no tenderness and no fullness. No erythema, tenderness or bleeding around the vagina. No foreign body around the vagina. Vaginal discharge (cottage cheese discharge with white/yellow color) found.  Musculoskeletal: Normal range of motion. She exhibits no edema.  Neurological: She is alert and oriented to person, place, and time.      Assessment & Plan:  Per problem list.

## 2011-10-31 NOTE — Patient Instructions (Signed)
Dear Ms. [name],   It was great to see you today. Thank you for coming to clinic. Please read below regarding the issues that we discussed.   1. We will call you with the results of your vaginal testing and call in any appropriate medicines.  2. We did a pap smear today.  3. I would like you to schedule an appointment with Dr. Louanne Belton to discuss your pap results, possible cholesterol screening per your request, and planning for IUD removal and reinsertion (would need to be a separate visit). Ask at the front desk for his next available appointment that works with your schedule  Please call earlier if you have any questions or concerns.   Sincerely,  Dr. Tana Conch

## 2011-10-31 NOTE — Assessment & Plan Note (Signed)
Repeat PAP today and follow up with Dr. Louanne Belton within next month when results available.

## 2011-10-31 NOTE — Progress Notes (Signed)
Addended by: Garen Grams F on: 10/31/2011 01:33 PM   Modules accepted: Orders

## 2011-10-31 NOTE — Assessment & Plan Note (Signed)
Advised walking 3x per week for 20 minutes. Also advised 5 servings fruits and veggies per day. Patient agreeable to exercise. Will follow up with PCP. Goal to prevent long term sequela of obesity.

## 2011-10-31 NOTE — Progress Notes (Signed)
Addended by: Shelva Majestic on: 10/31/2011 04:10 PM   Modules accepted: Orders

## 2011-10-31 NOTE — Assessment & Plan Note (Addendum)
Discharge for 2 days. Itching. Cottage cheese consistency, fish odor. Suspect yeast infection, possible BV. Has had 1 new sexual partner. No urinary burning or frequency.   Wet prep shows yeast and BV-rx for flagyl and diflucan sent to pharmacy. 2 doses of diflucan 1 at beginning and end of metronidazole therapy as reviewed chart and has history of requiring 2 doses.   Pending GC/Ch. Patient ok with calling results.

## 2011-11-03 ENCOUNTER — Encounter: Payer: Self-pay | Admitting: Family Medicine

## 2011-11-12 ENCOUNTER — Encounter: Payer: Self-pay | Admitting: Family Medicine

## 2011-11-14 ENCOUNTER — Encounter: Payer: Self-pay | Admitting: Family Medicine

## 2012-03-23 ENCOUNTER — Other Ambulatory Visit (HOSPITAL_COMMUNITY)
Admission: RE | Admit: 2012-03-23 | Discharge: 2012-03-23 | Disposition: A | Discharge status: Home / Self Care | Payer: Self-pay | Source: Ambulatory Visit | Attending: Family Medicine | Admitting: Family Medicine

## 2012-03-23 ENCOUNTER — Encounter: Payer: Self-pay | Admitting: Family Medicine

## 2012-03-23 ENCOUNTER — Ambulatory Visit (INDEPENDENT_AMBULATORY_CARE_PROVIDER_SITE_OTHER): Payer: Self-pay | Admitting: Family Medicine

## 2012-03-23 VITALS — BP 130/80 | HR 80 | Ht <= 58 in | Wt 155.7 lb

## 2012-03-23 DIAGNOSIS — B373 Candidiasis of vulva and vagina: Secondary | ICD-10-CM

## 2012-03-23 DIAGNOSIS — Z113 Encounter for screening for infections with a predominantly sexual mode of transmission: Secondary | ICD-10-CM | POA: Insufficient documentation

## 2012-03-23 DIAGNOSIS — N898 Other specified noninflammatory disorders of vagina: Secondary | ICD-10-CM

## 2012-03-23 DIAGNOSIS — B3731 Acute candidiasis of vulva and vagina: Secondary | ICD-10-CM | POA: Insufficient documentation

## 2012-03-23 DIAGNOSIS — Z8619 Personal history of other infectious and parasitic diseases: Secondary | ICD-10-CM

## 2012-03-23 LAB — POCT WET PREP (WET MOUNT)
Clue Cells Wet Prep Whiff POC: NEGATIVE
WBC, Wet Prep HPF POC: >20

## 2012-03-23 MED ORDER — FLUCONAZOLE 150 MG PO TABS: 150.0000 mg | ORAL_TABLET | Freq: Once | ORAL | Status: AC

## 2012-03-23 NOTE — Patient Instructions (Addendum)
Will call you with abnormal results [phone]  Will send letter with normal

## 2012-03-23 NOTE — Assessment & Plan Note (Signed)
candidal vaginitis treated, send diflucan to pharmacy.  Encouraged condom use, checked GC/Chalmydia as well, results pending.

## 2012-03-23 NOTE — Progress Notes (Signed)
  Subjective:    Patient ID: [name], female    DOB: [birth year], 36 y.o.   MRN: [id number]  HPI  2 weeks of vaginal itching, discharge.  Unprotected sex, off and on partner.  History of chlamydia about 5 years ago.  Self treated for yeast vaginitis with monistat at home.  Improved for a few days, then recurred.  I have reviewed patient's  PMH, FH, and Social history and Medications as related to this visit. No history of immunocompromise. Review of SystemsNo fever, chills, nausea, abdominal pain, dysuria.       Objective:   Physical Exam GEN: NAD Pelvic Exam:        External: normal female genitalia without lesions or masses        Vagina: normal without lesions or masses.  Thick white discharge noted.        Cervix: normal without lesions or masses        Adnexa: normal bimanual exam without masses or fullness        Uterus: normal by palpation        Samples for Wet prep, GC/Chlamydia obtained        Assessment & Plan:

## 2012-03-23 NOTE — Progress Notes (Signed)
Subjective:     Patient ID: [name], female   DOB: [birth year], 36 y.o.   MRN: [id number]  HPI [name] is a 36 y/o otherwise healthy woman who comes to clinic today for evaluation of vaginal itching and discharge.   She says her symptoms  began last week. She thought it was a yeast infection at the time, and tried a 3 day over the counter treatment. She thought this was helping, but after the treatment noticed that the discharge and itching seemed to come back.   She has had both a yeast infection and BV in the past and thinks it could be one of those.   She has had one new sexual partner recently. She has not been using barrier protection. She does have a Mirena IUD and does not have periods.   She denies any burning with urination, vaginal bleeding, blood in her urine, or other associated symptoms.   Review of Systems As per above.     Objective:   Physical Exam General: Well appearing, no acute distress GU: Normal external female genitalia. Cottage cheese like vaginal discharge present.  IUD visualized and in correct position. Cervix non-friable.     Assessment/Plan:  1. Vaginal/Discharge: Vaginal itching and cottage cheese like discharge are most consistent with yeast infection, although persistence through treatment is not common. Will await results of wet prep before prescribing treatment.

## 2012-03-24 ENCOUNTER — Encounter: Payer: Self-pay | Admitting: Family Medicine

## 2012-06-15 ENCOUNTER — Encounter (HOSPITAL_COMMUNITY): Payer: Self-pay | Admitting: Emergency Medicine

## 2012-06-15 ENCOUNTER — Emergency Department (HOSPITAL_COMMUNITY)
Admission: EM | Admit: 2012-06-15 | Discharge: 2012-06-16 | Disposition: A | Discharge status: Home / Self Care | Payer: Self-pay | Attending: Emergency Medicine | Admitting: Emergency Medicine

## 2012-06-15 DIAGNOSIS — N390 Urinary tract infection, site not specified: Secondary | ICD-10-CM | POA: Insufficient documentation

## 2012-06-15 LAB — URINALYSIS, ROUTINE W REFLEX MICROSCOPIC
Glucose, UA: NEGATIVE mg/dL
Protein, ur: 100 mg/dL — AB
Specific Gravity, Urine: 1.021 (ref 1.005–1.030)
pH: 6.5 (ref 5.0–8.0)

## 2012-06-15 LAB — PREGNANCY, URINE: Preg Test, Ur: NEGATIVE

## 2012-06-15 NOTE — ED Notes (Addendum)
Pt c/o lower pelvic pain. Noticed blood on tissue when she wiped. Has had an Mirena IUD x 5 yrs. Noticed a moderate amount of blood post void and in pad. Symptoms onset today. Has severe pain when voiding. Denies pain when sitting.  Pt states she has intermittent vulva irritation and itching x 2 weeks Denies discharge and odor.

## 2012-06-15 NOTE — ED Notes (Signed)
Pt states she is here for dysuria and hematuria that started yesterday  Pt states she has had an ongoing yeast infection but only on the outside area for the past couple of weeks  Pt states she has been using AZO

## 2012-06-16 MED ORDER — FLUCONAZOLE 150 MG PO TABS: 150.0000 mg | ORAL_TABLET | Freq: Once | ORAL | Status: DC

## 2012-06-16 MED ORDER — PHENAZOPYRIDINE HCL 100 MG PO TABS
100.0000 mg | ORAL_TABLET | Freq: Once | ORAL | Status: AC
  Administered 2012-06-16: 100 mg via ORAL
  Filled 2012-06-16: qty 1

## 2012-06-16 MED ORDER — PHENAZOPYRIDINE HCL 200 MG PO TABS: 200.0000 mg | ORAL_TABLET | Freq: Three times a day (TID) | ORAL | Status: DC

## 2012-06-16 MED ORDER — SULFAMETHOXAZOLE-TRIMETHOPRIM 800-160 MG PO TABS: 1.0000 | ORAL_TABLET | Freq: Two times a day (BID) | ORAL | Status: DC

## 2012-06-16 MED ORDER — SULFAMETHOXAZOLE-TMP DS 800-160 MG PO TABS
1.0000 | ORAL_TABLET | Freq: Once | ORAL | Status: AC
  Administered 2012-06-16: 1 via ORAL
  Filled 2012-06-16: qty 1

## 2012-06-16 NOTE — ED Provider Notes (Signed)
History     CSN: [id number]  Arrival date & time 06/15/12  2146   First MD Initiated Contact with Patient 06/15/12 2344      Chief Complaint  Patient presents with  . Hematuria  . Dysuria    (Consider location/radiation/quality/duration/timing/severity/associated sxs/prior treatment) Patient is a 36 y.o. female presenting with hematuria and dysuria. The history is provided by the patient.  Hematuria Associated symptoms include dysuria. Pertinent negatives include no abdominal pain, chills, fever, nausea or vomiting.  Dysuria  Associated symptoms include hematuria. Pertinent negatives include no chills, no nausea and no vomiting.   36 year old, female, with no significant past medical history presents emergency department complaining of hematuria.  Dysuria and frequency.  For the past 2 days.  She denies nausea, vomiting, back pain.  She denies fevers, or chills.  History reviewed. No pertinent past medical history.  Past Surgical History  Procedure Date  . Cesarean section     Family History  Problem Relation Age of Onset  . Hypertension Mother   . Diabetes Mother   . CAD Mother     History  Substance Use Topics  . Smoking status: Never Smoker   . Smokeless tobacco: Not on file  . Alcohol Use: Yes     occ    OB History    Grav Para Term Preterm Abortions TAB SAB Ect Mult Living                  Review of Systems  Constitutional: Negative for fever and chills.  Gastrointestinal: Negative for nausea, vomiting and abdominal pain.  Genitourinary: Positive for dysuria and hematuria.  Musculoskeletal: Negative for back pain.  Neurological: Negative for headaches.  All other systems reviewed and are negative.    Allergies  Review of patient's allergies indicates no known allergies.  Home Medications   Current Outpatient Rx  Name Route Sig Dispense Refill  . NAPROXEN SODIUM 220 MG PO TABS Oral Take 440 mg by mouth 2 (two) times daily with a meal.    .  PHENAZOPYRIDINE HCL 97.2 MG PO TABS Oral Take 97 mg by mouth 3 (three) times daily as needed.    Marland Kitchen FLUCONAZOLE 150 MG PO TABS Oral Take 1 tablet (150 mg total) by mouth once. 1 tablet 0  . PHENAZOPYRIDINE HCL 200 MG PO TABS Oral Take 1 tablet (200 mg total) by mouth 3 (three) times daily. 6 tablet 0  . SULFAMETHOXAZOLE-TRIMETHOPRIM 800-160 MG PO TABS Oral Take 1 tablet by mouth 2 (two) times daily. 6 tablet 0    BP 125/74  Pulse 65  Temp 98.3 F (36.8 C) (Oral)  Resp 16  SpO2 98%  Physical Exam  Nursing note and vitals reviewed. Constitutional: She is oriented to person, place, and time. She appears well-developed and well-nourished. No distress.  HENT:  Head: Normocephalic and atraumatic.  Eyes: Conjunctivae normal and EOM are normal.  Neck: Normal range of motion. Neck supple.  Cardiovascular: Normal rate, regular rhythm and intact distal pulses.   No murmur heard. Pulmonary/Chest: Effort normal and breath sounds normal.  Abdominal: Soft. Bowel sounds are normal. She exhibits no distension. There is tenderness. There is no guarding.       Mild suprapubic tenderness  Musculoskeletal: Normal range of motion.  Neurological: She is alert and oriented to person, place, and time.  Skin: Skin is warm and dry.  Psychiatric: She has a normal mood and affect. Thought content normal.    ED Course  Procedures (including critical care  time) 36 year old, female, with symptoms consistent with urinary tract infection.  Most likely cystitis rather than a pyelonephritis  Labs Reviewed  URINALYSIS, ROUTINE W REFLEX MICROSCOPIC - Abnormal; Notable for the following:    Color, Urine RED (*)  BIOCHEMICALS MAY BE AFFECTED BY COLOR   APPearance TURBID (*)     Hgb urine dipstick LARGE (*)     Bilirubin Urine SMALL (*)     Ketones, ur TRACE (*)     Protein, ur 100 (*)     Urobilinogen, UA 2.0 (*)     Nitrite POSITIVE (*)     Leukocytes, UA LARGE (*)     All other components within normal limits    PREGNANCY, URINE  URINE MICROSCOPIC-ADD ON  LAB REPORT - SCANNED   No results found.   1. UTI (urinary tract infection)       MDM  Urinary tract infection No evidence of pyelonephritis or toxicity        Cheri Guppy, MD 06/16/12 [phone]

## 2012-06-25 ENCOUNTER — Other Ambulatory Visit (HOSPITAL_COMMUNITY)
Admission: RE | Admit: 2012-06-25 | Discharge: 2012-06-25 | Disposition: A | Discharge status: Home / Self Care | Payer: Self-pay | Source: Ambulatory Visit | Attending: Family Medicine | Admitting: Family Medicine

## 2012-06-25 ENCOUNTER — Encounter: Payer: Self-pay | Admitting: Family Medicine

## 2012-06-25 ENCOUNTER — Ambulatory Visit (INDEPENDENT_AMBULATORY_CARE_PROVIDER_SITE_OTHER): Payer: Self-pay | Admitting: Family Medicine

## 2012-06-25 VITALS — BP 130/80 | HR 80 | Temp 98.3°F | Ht <= 58 in | Wt 157.0 lb

## 2012-06-25 DIAGNOSIS — N898 Other specified noninflammatory disorders of vagina: Secondary | ICD-10-CM

## 2012-06-25 DIAGNOSIS — Z113 Encounter for screening for infections with a predominantly sexual mode of transmission: Secondary | ICD-10-CM | POA: Insufficient documentation

## 2012-06-25 LAB — POCT WET PREP (WET MOUNT): Clue Cells Wet Prep Whiff POC: NEGATIVE

## 2012-06-25 NOTE — Assessment & Plan Note (Signed)
Wet prep and GC/Chlamydia done today, will notify pt with results.  F/U if not improving.

## 2012-06-25 NOTE — Progress Notes (Signed)
  Subjective:    Patient ID: [name], female    DOB: April 27, 1976, 36 y.o.   MRN: [id number]  HPI  [name] comes in with an odorous grey vaginal discharge that began a few days ago.  She has had several issues lately- she had a yeast infection,which was treated, then a UTI, which was treated and then she got another yeast infection.  Now she is worried it is bacterial vaginosis.  No dysuria, abdominal pain, nausea fevers.   Review of Systems See HPI    Objective:   Physical Exam BP 130/80  Pulse 80  Temp 98.3 F (36.8 C) (Oral)  Ht 4\' 8"  (1.422 m)  Wt 157 lb (71.215 kg)  BMI 35.20 kg/m2 General appearance: alert, cooperative and no distress Pelvic: cervix normal in appearance, external genitalia normal, no adnexal masses or tenderness, no cervical motion tenderness, rectovaginal septum normal, uterus normal size, shape, and consistency and mirena strings visible, vagaina with thin white discharge.       Assessment & Plan:

## 2012-06-25 NOTE — Patient Instructions (Signed)
It was good to see you. I will call you with your lab results.   

## 2012-06-26 ENCOUNTER — Telehealth: Payer: Self-pay | Admitting: Family Medicine

## 2012-06-26 NOTE — Telephone Encounter (Signed)
Patient is calling to see if there is going to be an Rx sent to [facility] for her.

## 2012-06-29 NOTE — Telephone Encounter (Signed)
Wet prep and GC/Chlamydia were all normal/negative.  Please call patient and let her know, no prescription indicated.

## 2012-06-29 NOTE — Telephone Encounter (Signed)
Informed patient of message from MD, patient states she is still having the discharge with odor and would like something sent in for this.

## 2012-06-29 NOTE — Telephone Encounter (Signed)
Called, left voice mail, all labs normal, nothing to treat, no medications necessary.  Discharge is normal physiologic discharge.    [name] 06/29/2012 10:06 AM

## 2012-07-08 ENCOUNTER — Encounter: Payer: Self-pay | Admitting: Family Medicine

## 2012-07-08 ENCOUNTER — Ambulatory Visit: Payer: Self-pay | Admitting: Family Medicine

## 2012-07-08 ENCOUNTER — Ambulatory Visit (INDEPENDENT_AMBULATORY_CARE_PROVIDER_SITE_OTHER): Payer: Self-pay | Admitting: Family Medicine

## 2012-07-08 VITALS — BP 144/82 | HR 56 | Ht <= 58 in | Wt 160.0 lb

## 2012-07-08 DIAGNOSIS — B9689 Other specified bacterial agents as the cause of diseases classified elsewhere: Secondary | ICD-10-CM

## 2012-07-08 DIAGNOSIS — N898 Other specified noninflammatory disorders of vagina: Secondary | ICD-10-CM

## 2012-07-08 DIAGNOSIS — A499 Bacterial infection, unspecified: Secondary | ICD-10-CM

## 2012-07-08 DIAGNOSIS — N760 Acute vaginitis: Secondary | ICD-10-CM

## 2012-07-08 LAB — POCT WET PREP (WET MOUNT): Clue Cells Wet Prep Whiff POC: POSITIVE

## 2012-07-08 NOTE — Progress Notes (Signed)
  Subjective:    Patient ID: [name], female    DOB: 1975/11/16, 36 y.o.   MRN: [id number]  HPI  36 year old F with recent evaluation for vaginal discharge who presents today for worsening vaginal discharge and odor. Her previous wet prep showed few clue cells and 3+ cocci, but she was not treated. Since that time she notes worsening discharge and smell. The discharge is yellow and white. It is not associated with bleeding, pelvic pain, or fever. She has no new sexual contacts since the last tests for GC/CT.   Review of Systems See HPI    Objective:   Physical Exam BP 144/82  Pulse 56  Ht 4\' 8"  (1.422 m)  Wt 160 lb (72.576 kg)  BMI 35.87 kg/m2 Gen: well appearing middle age AAF, pleasant and conversant Pelvic: normal external genitalia, normal vaginal mucosa with minimal white discharge, no CMT, no adnexal mass, no inguinal lymphadenopathy  Microscopic wet-mount exam shows clue cells, positive whiff.      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

## 2012-07-09 ENCOUNTER — Telehealth: Payer: Self-pay | Admitting: Family Medicine

## 2012-07-09 DIAGNOSIS — N760 Acute vaginitis: Secondary | ICD-10-CM | POA: Insufficient documentation

## 2012-07-09 MED ORDER — METRONIDAZOLE 1 % EX GEL: CUTANEOUS | Status: DC

## 2012-07-09 NOTE — Telephone Encounter (Signed)
I left a message for the patient letting her know that have the results of her wet prep. I will send an Rx for flagyl to the pharmacy.

## 2012-07-09 NOTE — Assessment & Plan Note (Signed)
Patient prefers flagyl gel. BID x 5 days.

## 2012-07-10 ENCOUNTER — Telehealth: Payer: Self-pay | Admitting: Family Medicine

## 2012-07-10 DIAGNOSIS — B9689 Other specified bacterial agents as the cause of diseases classified elsewhere: Secondary | ICD-10-CM

## 2012-07-10 MED ORDER — METRONIDAZOLE 500 MG PO TABS: 500.0000 mg | ORAL_TABLET | Freq: Two times a day (BID) | ORAL | Status: AC

## 2012-07-10 NOTE — Telephone Encounter (Signed)
Patient has no insurance. Will forward message to MD.

## 2012-07-10 NOTE — Telephone Encounter (Signed)
Pt states that she cannot afford the $200 for Metronidazole -is there anything else that she can take?  states that we can leave a message on her VM [facility] [address]

## 2012-07-10 NOTE — Telephone Encounter (Signed)
Message left on voicemail that new Rx has been sent.

## 2012-07-10 NOTE — Telephone Encounter (Signed)
Please tell her I will send a Rx for Flagyl pills. That will be the cheapest option. There are no other creams.

## 2012-09-16 ENCOUNTER — Ambulatory Visit (INDEPENDENT_AMBULATORY_CARE_PROVIDER_SITE_OTHER): Payer: Self-pay | Admitting: Family Medicine

## 2012-09-16 ENCOUNTER — Encounter: Payer: Self-pay | Admitting: Family Medicine

## 2012-09-16 VITALS — BP 118/78 | HR 66 | Temp 98.9°F | Wt 159.1 lb

## 2012-09-16 DIAGNOSIS — N760 Acute vaginitis: Secondary | ICD-10-CM

## 2012-09-16 DIAGNOSIS — B9689 Other specified bacterial agents as the cause of diseases classified elsewhere: Secondary | ICD-10-CM

## 2012-09-16 DIAGNOSIS — A499 Bacterial infection, unspecified: Secondary | ICD-10-CM

## 2012-09-16 LAB — POCT WET PREP (WET MOUNT)

## 2012-09-16 MED ORDER — METRONIDAZOLE 500 MG PO TABS: 500.0000 mg | ORAL_TABLET | Freq: Two times a day (BID) | ORAL | Status: AC

## 2012-09-16 MED ORDER — METRONIDAZOLE 0.75 % VA GEL: 1.0000 | Freq: Every day | VAGINAL | Status: DC

## 2012-09-16 NOTE — Patient Instructions (Signed)
Take metronidazole pills for 7 days. OR use metronidazole gel every night for 10 days and then use twice weekly for 4 to 6 months. Follow up with your doctor in 4 months to reevaluate.  Bacterial Vaginosis Bacterial vaginosis (BV) is a vaginal infection where the normal balance of bacteria in the vagina is disrupted. The normal balance is then replaced by an overgrowth of certain bacteria. There are several different kinds of bacteria that can cause BV. BV is the most common vaginal infection in women of childbearing age. CAUSES   The cause of BV is not fully understood. BV develops when there is an increase or imbalance of harmful bacteria.  Some activities or behaviors can upset the normal balance of bacteria in the vagina and put women at increased risk including:  Having a new sex partner or multiple sex partners.  Douching.  Using an intrauterine device (IUD) for contraception.  It is not clear what role sexual activity plays in the development of BV. However, women that have never had sexual intercourse are rarely infected with BV. Women do not get BV from toilet seats, bedding, swimming pools or from touching objects around them.  SYMPTOMS   Grey vaginal discharge.  A fish-like odor with discharge, especially after sexual intercourse.  Itching or burning of the vagina and vulva.  Burning or pain with urination.  Some women have no signs or symptoms at all. DIAGNOSIS  Your caregiver must examine the vagina for signs of BV. Your caregiver will perform lab tests and look at the sample of vaginal fluid through a microscope. They will look for bacteria and abnormal cells (clue cells), a pH test higher than 4.5, and a positive amine test all associated with BV.  RISKS AND COMPLICATIONS   Pelvic inflammatory disease (PID).  Infections following gynecology surgery.  Developing HIV.  Developing herpes virus. TREATMENT  Sometimes BV will clear up without treatment. However, all  women with symptoms of BV should be treated to avoid complications, especially if gynecology surgery is planned. Female partners generally do not need to be treated. However, BV may spread between female sex partners so treatment is helpful in preventing a recurrence of BV.   BV may be treated with antibiotics. The antibiotics come in either pill or vaginal cream forms. Either can be used with nonpregnant or pregnant women, but the recommended dosages differ. These antibiotics are not harmful to the baby.  BV can recur after treatment. If this happens, a second round of antibiotics will often be prescribed.  Treatment is important for pregnant women. If not treated, BV can cause a premature delivery, especially for a pregnant woman who had a premature birth in the past. All pregnant women who have symptoms of BV should be checked and treated.  For chronic reoccurrence of BV, treatment with a type of prescribed gel vaginally twice a week is helpful. HOME CARE INSTRUCTIONS   Finish all medication as directed by your caregiver.  Do not have sex until treatment is completed.  Tell your sexual partner that you have a vaginal infection. They should see their caregiver and be treated if they have problems, such as a mild rash or itching.  Practice safe sex. Use condoms. Only have 1 sex partner. PREVENTION  Basic prevention steps can help reduce the risk of upsetting the natural balance of bacteria in the vagina and developing BV:  Do not have sexual intercourse (be abstinent).  Do not douche.  Use all of the medicine prescribed for  treatment of BV, even if the signs and symptoms go away.  Tell your sex partner if you have BV. That way, they can be treated, if needed, to prevent reoccurrence. SEEK MEDICAL CARE IF:   Your symptoms are not improving after 3 days of treatment.  You have increased discharge, pain, or fever. MAKE SURE YOU:   Understand these instructions.  Will watch your  condition.  Will get help right away if you are not doing well or get worse. FOR MORE INFORMATION  Division of STD Prevention (DSTDP), Centers for Disease Control and Prevention: [employer]s.[employer] American Social Health Association (ASHA): www.ashastd.org  Document Released: 08/12/2005 Document Revised: 11/04/2011 Document Reviewed: 02/02/2009 [facility] Patient Information 2013 [place], [place].

## 2012-09-16 NOTE — Assessment & Plan Note (Addendum)
Recommend metrogel for 10 days (or metronidazole PO for 7 days) then vaginal gel twice weekly for 4 to 6 months.  Pt applying for medicaid to cover cost or will shop around for reasonably priced Metronidazole vaginal gel. Okay to use Rephresh (lactobacillus formulation for vaginal application) though explained that there are no clinical trials yet to show evidence for effectiveness. F/U in 4 months if recurrent.

## 2012-09-16 NOTE — Progress Notes (Signed)
  Subjective:    Patient ID: [name], female    DOB: Jun 02, 1976, 37 y.o.   MRN: [id number]  HPI 37 y.o. with vaginal discharge. Slimy, gray. Malodorous.No itching or discomfort. No dysuria. Has had several episodes of BV in past year as well as yeast. This one started 3-4 days ago. Last treated a few months ago (November). Seems to be related to increased sexual activity. One partner, does not use condoms, but more frequent intercourse over past week. Does not douche, does take occasional hot bath. No change in soaps or detergents recently. No concern for STD and recent GC/Chlamydia negative.  Patient has seen adds for Rephresh vaginal treatment for BV and wonders if it is okay to use.  LMP: years ago. Has IUD (5 years ago).   Review of Systems  Constitutional: Negative for fever, chills and diaphoresis.  Gastrointestinal: Negative for nausea, vomiting and diarrhea.  Genitourinary: Negative for dysuria, urgency, frequency, flank pain, vaginal bleeding, difficulty urinating, vaginal pain, menstrual problem, pelvic pain and dyspareunia.       Objective:   Physical Exam  Constitutional: She is oriented to person, place, and time. She appears well-developed and well-nourished. No distress.  HENT:  Head: Normocephalic and atraumatic.  Eyes: Conjunctivae normal and EOM are normal.  Neck: Normal range of motion.  Cardiovascular: Normal rate.   Pulmonary/Chest: Effort normal. No respiratory distress.  Abdominal: Soft. There is no tenderness. There is no rebound and no guarding.  Genitourinary:       Normal external genitalia. Normal vagina, moderate amount white/grey watery discharge. No bleeding. Normal cervix.   Neurological: She is alert and oriented to person, place, and time.  Skin: Skin is warm and dry.  Psychiatric: She has a normal mood and affect.   Filed Vitals:   09/16/12 1605  BP: 118/78  Pulse: 66  Temp: 98.9 F (37.2 C)       Assessment & Plan:  37 y.o. female  with Recurrent BV. Recommend metrogel for 10 days (or metronidazole PO for 7 days) then vaginal gel twice weekly for 4 to 6 months.  Pt applying for medicaid to cover cost or will shop around for reasonably priced Metronidazole vaginal gel. Okay to use Rephresh (lactobacillus formulation for vaginal application) though explained that there are no clinical trials yet to show evidence for effectiveness. F/U in 4 months if recurrent.

## 2012-10-22 ENCOUNTER — Ambulatory Visit (INDEPENDENT_AMBULATORY_CARE_PROVIDER_SITE_OTHER): Payer: Medicaid Other | Admitting: Family Medicine

## 2012-10-22 ENCOUNTER — Encounter: Payer: Self-pay | Admitting: Family Medicine

## 2012-10-22 ENCOUNTER — Other Ambulatory Visit (HOSPITAL_COMMUNITY)
Admission: RE | Admit: 2012-10-22 | Discharge: 2012-10-22 | Disposition: A | Discharge status: Home / Self Care | Payer: Medicaid Other | Source: Ambulatory Visit | Attending: Family Medicine | Admitting: Family Medicine

## 2012-10-22 VITALS — BP 117/67 | HR 88 | Temp 99.4°F | Ht <= 58 in | Wt 163.0 lb

## 2012-10-22 DIAGNOSIS — N760 Acute vaginitis: Secondary | ICD-10-CM

## 2012-10-22 DIAGNOSIS — A499 Bacterial infection, unspecified: Secondary | ICD-10-CM

## 2012-10-22 DIAGNOSIS — Z3009 Encounter for other general counseling and advice on contraception: Secondary | ICD-10-CM

## 2012-10-22 DIAGNOSIS — Z113 Encounter for screening for infections with a predominantly sexual mode of transmission: Secondary | ICD-10-CM | POA: Insufficient documentation

## 2012-10-22 LAB — POCT WET PREP (WET MOUNT)
Clue Cells Wet Prep Whiff POC: POSITIVE
WBC, Wet Prep HPF POC: <5

## 2012-10-22 MED ORDER — METRONIDAZOLE 0.75 % VA GEL: 1.0000 | Freq: Every day | VAGINAL | Status: DC

## 2012-10-22 MED ORDER — METRONIDAZOLE 500 MG PO TABS: 500.0000 mg | ORAL_TABLET | Freq: Two times a day (BID) | ORAL | Status: DC

## 2012-10-22 NOTE — Patient Instructions (Signed)
I am giving you two prescriptions.  One is for the vaginal gel, the other is for the same medication in pill form.  If you can find the gel at a reasonable price, use it.  Otherwise you can use the pill form.  Once we know that you don't have some type of infection (probably by Monday), you can schedule an appointment for changing your IUD.

## 2012-10-23 ENCOUNTER — Telehealth: Payer: Self-pay | Admitting: Family Medicine

## 2012-10-23 NOTE — Telephone Encounter (Signed)
Can you please let [name] know that her wet prep does show bacterial vaginosis, but no other infection.  She should take the medication I gave her at her visit.  This diagnosis would not keep her from getting her IUD changed next week, assuming the rest of her test results are OK.

## 2012-10-23 NOTE — Telephone Encounter (Signed)
Informed pt of test results and msg from Dr Louanne Belton

## 2012-10-26 NOTE — Progress Notes (Signed)
Patient ID: [name], female   DOB: 04-27-1976, 37 y.o.   MRN: [id number] Subjective: The patient is a 37 y.o. year old female who presents today for vaginal discharege.  Patient reports she has been having problems with vaginal discharge for several weeks now.  No abd pain, no bleeding.  Discharge is clear to white and malodorous.  Does not have a new partner but is slightly concerned that current partner may have cheated.  No fevers/chills.  She is also due for IUD change.  Patient's past medical, social, and family history were reviewed and updated as appropriate. History  Substance Use Topics  . Smoking status: Never Smoker   . Smokeless tobacco: Not on file  . Alcohol Use: Yes     Comment: occ   Objective:  Filed Vitals:   10/22/12 1637  BP: 117/67  Pulse: 88  Temp: 99.4 F (37.4 C)   Gen: NAD Pelvic exam: normal external genitalia, vulva, vagina, cervix, uterus and adnexa, white discharge, malodorous.  No CMT.  IUD strings clearly visible  Assessment/Plan: STD check.  Most likely BV.  Will give rx as she appears to have had problems with that in the past.  If check comes back clean, she can return to clinic next week for IUD change.  Please also see individual problems in problem list for problem-specific plans.

## 2012-12-24 ENCOUNTER — Ambulatory Visit (INDEPENDENT_AMBULATORY_CARE_PROVIDER_SITE_OTHER): Payer: Medicaid Other | Admitting: Family Medicine

## 2012-12-24 VITALS — Ht <= 58 in | Wt 159.0 lb

## 2012-12-24 DIAGNOSIS — R399 Unspecified symptoms and signs involving the genitourinary system: Secondary | ICD-10-CM

## 2012-12-24 DIAGNOSIS — R3989 Other symptoms and signs involving the genitourinary system: Secondary | ICD-10-CM

## 2012-12-24 DIAGNOSIS — R109 Unspecified abdominal pain: Secondary | ICD-10-CM

## 2012-12-24 DIAGNOSIS — N898 Other specified noninflammatory disorders of vagina: Secondary | ICD-10-CM

## 2012-12-24 LAB — POCT UA - MICROSCOPIC ONLY

## 2012-12-24 LAB — POCT WET PREP (WET MOUNT): Clue Cells Wet Prep Whiff POC: POSITIVE

## 2012-12-24 MED ORDER — METRONIDAZOLE 500 MG PO TABS: 500.0000 mg | ORAL_TABLET | Freq: Two times a day (BID) | ORAL | Status: DC

## 2012-12-24 MED ORDER — PHENAZOPYRIDINE HCL 100 MG PO TABS: 100.0000 mg | ORAL_TABLET | Freq: Three times a day (TID) | ORAL | Status: DC | PRN

## 2012-12-24 NOTE — Assessment & Plan Note (Addendum)
History consistent with UTI. UA shows blood and leuks. Will send for culture, but no treatment at this time. Also, wet prep had +whiff and few clues so we will treat for BV given her vague pain and concern for STD exposure. Patient did refuse GC/Ch today, but encouraged to RTC if she has new symptoms or finds out if her partner is positive.  Addendum: Discussed results with patient. She states she is "not satisfied" with her results since she does not have obvious UTI. Will call patient if her culture is positive, but for now, will send in pyridium for spasm and treat BV which may help her discomfort as well.

## 2012-12-24 NOTE — Addendum Note (Signed)
Addended by: [place], Smith Potenza on: 12/24/2012 06:57 PM   Modules accepted: Orders

## 2012-12-24 NOTE — Patient Instructions (Signed)
It was nice to see you today. I am sorry we do not have your results yet, but I will call you this afternoon.  If anything changes, let me know!  Chosen Geske M. Lameshia Hypolite, M.D.

## 2012-12-24 NOTE — Progress Notes (Signed)
Patient ID: ALISIA VANENGEN, female   DOB: Jul 16, 1976, 37 y.o.   MRN: [id number]  [facility] Deaven Urwin M. Maleena Eddleman, MD Phone: [phone]   Subjective: HPI: Patient is a 37 y.o. female presenting to clinic today for same day appointment. Concerns today include   Had phone call yesterday from partner that he had a discharge from penis. They did tests and she states that he reported no positive results. She states he has been with other partners.  She had yeast infection last week, treated with OTC and resolve. This morning she woke up with dysuria. Burning inside when she urinates. Saw some blood on tissue. Increased frequency and hesitancy. No fevers, no flank pain, no abdominal pain. Last bladder infection was end of last year.    Denies any vaginal discharge. No itching, no external bumps. LMP unsure, Mirena has been in 6 years.  Not taking any prescription medications, but took Azo this morning.   History Reviewed: Non-smoker. Health Maintenance: Last pap smear was last year.  ROS: Please see HPI above.  Objective: Office vital signs reviewed. Ht 4\' 8"  (1.422 m)  Wt 159 lb (72.122 kg)  BMI 35.67 kg/m2  Physical Examination:  General: Awake, alert. NAD. HEENT: Atraumatic, normocephalic. MMM Neck: No masses palpated. No LAD Pulm: CTAB, no wheezes Cardio: RRR, no murmurs appreciated Abdomen:+BS, soft, nondistended. Very little TTP suprapubic region. Neuro: Grossly intact  Assessment: 37 y.o. female with abdominal pain  Plan: See Problem List and After Visit Summary

## 2012-12-25 ENCOUNTER — Ambulatory Visit (INDEPENDENT_AMBULATORY_CARE_PROVIDER_SITE_OTHER): Payer: Medicaid Other | Admitting: Family Medicine

## 2012-12-25 ENCOUNTER — Other Ambulatory Visit (HOSPITAL_COMMUNITY)
Admission: RE | Admit: 2012-12-25 | Discharge: 2012-12-25 | Disposition: A | Discharge status: Home / Self Care | Payer: Medicaid Other | Source: Ambulatory Visit | Attending: Family Medicine | Admitting: Family Medicine

## 2012-12-25 ENCOUNTER — Encounter: Payer: Self-pay | Admitting: Family Medicine

## 2012-12-25 VITALS — BP 130/75 | HR 88 | Temp 99.2°F | Ht <= 58 in | Wt 159.6 lb

## 2012-12-25 DIAGNOSIS — Z113 Encounter for screening for infections with a predominantly sexual mode of transmission: Secondary | ICD-10-CM | POA: Insufficient documentation

## 2012-12-25 DIAGNOSIS — N390 Urinary tract infection, site not specified: Secondary | ICD-10-CM | POA: Insufficient documentation

## 2012-12-25 DIAGNOSIS — R300 Dysuria: Secondary | ICD-10-CM

## 2012-12-25 MED ORDER — CEPHALEXIN 500 MG PO CAPS: 500.0000 mg | ORAL_CAPSULE | Freq: Three times a day (TID) | ORAL | Status: DC

## 2012-12-25 NOTE — Assessment & Plan Note (Signed)
Treat with Keflex. Await culture results.

## 2012-12-25 NOTE — Patient Instructions (Signed)
We will be in touch with you about your test results on Monday. Take your antibiotic 3 times per day for the next 7 days.

## 2012-12-25 NOTE — Progress Notes (Signed)
Patient ID: SHARA HARTIS, female   DOB: 08/27/1975, 37 y.o.   MRN: [id number] Subjective: The patient is a 37 y.o. year old female who presents today for continued dysuria.  Patient was seen yesterday for these symptoms. She was sent home without antibiotics. Treat for this that time she has been having some problems with chills although she is not having any fevers, back pain, sweats, nausea, vomiting, or diarrhea. She's been taking the Flagyl for her bacterial vaginosis.  Patient's past medical, social, and family history were reviewed and updated as appropriate. History  Substance Use Topics  . Smoking status: Never Smoker   . Smokeless tobacco: Not on file  . Alcohol Use: Yes     Comment: occ   Objective:  Filed Vitals:   12/25/12 1525  BP: 130/75  Pulse: 88  Temp: 99.2 F (37.3 C)   Gen: No acute distress Back: No CVA tenderness Abdomen: Mild suprapubic tenderness  Assessment/Plan: Patient's decided that she would like to be checked for sexually transmitted infections. We will obtain a urine gonorrhea and chlamydia as her wet prep excluded trichomonas.  Please also see individual problems in problem list for problem-specific plans.

## 2013-04-15 ENCOUNTER — Telehealth: Payer: Self-pay | Admitting: Family Medicine

## 2013-04-15 ENCOUNTER — Ambulatory Visit (INDEPENDENT_AMBULATORY_CARE_PROVIDER_SITE_OTHER): Payer: Medicaid Other | Admitting: Family Medicine

## 2013-04-15 ENCOUNTER — Other Ambulatory Visit (HOSPITAL_COMMUNITY)
Admission: RE | Admit: 2013-04-15 | Discharge: 2013-04-15 | Disposition: A | Discharge status: Home / Self Care | Payer: Medicaid Other | Source: Ambulatory Visit | Attending: Family Medicine | Admitting: Family Medicine

## 2013-04-15 ENCOUNTER — Encounter: Payer: Self-pay | Admitting: Family Medicine

## 2013-04-15 VITALS — BP 127/84 | HR 60 | Ht <= 58 in | Wt 152.0 lb

## 2013-04-15 DIAGNOSIS — N898 Other specified noninflammatory disorders of vagina: Secondary | ICD-10-CM

## 2013-04-15 DIAGNOSIS — Z113 Encounter for screening for infections with a predominantly sexual mode of transmission: Secondary | ICD-10-CM | POA: Insufficient documentation

## 2013-04-15 LAB — POCT WET PREP (WET MOUNT)

## 2013-04-15 MED ORDER — METRONIDAZOLE 500 MG PO TABS: 500.0000 mg | ORAL_TABLET | Freq: Two times a day (BID) | ORAL | Status: DC

## 2013-04-15 MED ORDER — FLUCONAZOLE 150 MG PO TABS: 150.0000 mg | ORAL_TABLET | Freq: Once | ORAL | Status: DC

## 2013-04-15 NOTE — Patient Instructions (Signed)
It was nice to meet you today!  I will call you if we need to treat anything on your tests.   Schedule an appointment to come back and have your IUD removed, and another one placed, as soon as possible. It may no longer be effective if you've had it over 5 years.  Be well, Dr. Pollie Meyer

## 2013-04-15 NOTE — Telephone Encounter (Signed)
Called pt to let her know wet prep shows BV & yeast. Will rx metronidazole and diflucan. Advised not to drink alcohol while taking flagyl. Reiterated importance of barrier contraception since her Mirena IUD has been in for over 6 years. Pt understood.

## 2013-04-15 NOTE — Progress Notes (Signed)
Patient ID: [name], female   DOB: 1975/11/22, 37 y.o.   MRN: [id number]   HPI:  Vaginal discharge: started about one week ago. Has been reddish brown. Has had some itching last night. Has Mirena IUD. Has had it for over 5 years. Wants another Mirena when she gets this one out. Sexually active with one female partner. Only one partner in last year. Not opposed to being checked for STD's. Has recurrent bacterial vaginosis for which she uses metrogel when she has a flare up. Can tell she has a flare by noticing odor. She noticed odor today. Does not get periods. No pelvic pain.   ROS: See HPI. + discharge with odor, no pelvic pain.  PMFSH: hx recurrent BV  PHYSICAL EXAM: BP 127/84  Pulse 60  Ht 4\' 8"  (1.422 m)  Wt 152 lb (68.947 kg)  BMI 34.1 kg/m2 Gen: NAD Lungs: NWOB Neuro: grossly nonfocal, speech intact GU: normal appearing external genitalia. Thin white discharge present in the vagina. No adnexal masses or tenderness appreciable. No cervical motion tenderness. IUD strings visible at os.  ASSESSMENT/PLAN:  # Vaginal discharge:  -will obtain wet prep today, along with gc/chlamydia -offered HIV/RPR testing today to complete STD screening but patient declined -will call patient with results and treat as indicated, she gave verbal permission to leave detailed voicemail on her cell phone [phone]).  # Contraception management: - patient reports having her Mirena IUD in for over 6 years, should have had it removed last July (2013). Advised that this may no longer be a working form of contraception. Given that she has had some reddish brown discharge, UPT was checked today and was negative. Advised that she come in as soon as possible to get her IUD removed and have another one placed. Explained that she is at risk to become pregnant as long as she is having unprotected intercourse with this expired IUD in place.

## 2013-04-16 ENCOUNTER — Telehealth: Payer: Self-pay | Admitting: Family Medicine

## 2013-04-16 NOTE — Telephone Encounter (Signed)
Attempted to contact patient to relay positive gonorrhea result. Left generic voicemail asking her to return our call.  Patient will need to come in to clinic for treatment with: -Ceftriaxone 250mg  IM x1 AND -Azithromycin 1000mg  PO x1  When patient returns call, please inform her of this result and have her schedule a visit with a nurse to be treated. Also please advise that her partner needs to be treated as well, and she should not have intercourse until two weeks after both she and her partner have been treated.  Latrelle Dodrill, MD

## 2013-04-19 ENCOUNTER — Ambulatory Visit (INDEPENDENT_AMBULATORY_CARE_PROVIDER_SITE_OTHER): Payer: Medicaid Other | Admitting: *Deleted

## 2013-04-19 DIAGNOSIS — Z8619 Personal history of other infectious and parasitic diseases: Secondary | ICD-10-CM

## 2013-04-19 MED ORDER — AZITHROMYCIN 500 MG PO TABS
1000.0000 mg | ORAL_TABLET | Freq: Once | ORAL | Status: AC
  Administered 2013-04-19: 1000 mg via ORAL

## 2013-04-19 MED ORDER — CEFTRIAXONE SODIUM 1 G IJ SOLR
250.0000 mg | Freq: Once | INTRAMUSCULAR | Status: AC
  Administered 2013-04-19: 250 mg via INTRAMUSCULAR

## 2013-04-19 NOTE — Progress Notes (Deleted)
  Subjective:    Patient ID: [name], female    DOB: 1975-10-27, 37 y.o.   MRN: [id number]  HPI    Review of Systems     Objective:   Physical Exam        Assessment & Plan:

## 2013-04-19 NOTE — Progress Notes (Signed)
Pt here today for tx of dx std - given 1000 azithromycin and 250 rocephin IM - instructed no intercourse for 14 days - verbalized understanding - no further questions - CDC report faxed to [facility] dept. Wyatt Haste, RN-BSN

## 2013-04-19 NOTE — Telephone Encounter (Signed)
Called patient again and relayed positive result. Advised both she and partner need treatment and to abstain from intercourse for 14 days after both parties are treated. She will come this afternoon for treatment. Notified Ricard Dillon who is aware that patient will be coming.  Latrelle Dodrill, MD

## 2013-05-11 ENCOUNTER — Ambulatory Visit (INDEPENDENT_AMBULATORY_CARE_PROVIDER_SITE_OTHER): Payer: Self-pay | Admitting: Family Medicine

## 2013-05-11 ENCOUNTER — Other Ambulatory Visit (HOSPITAL_COMMUNITY)
Admission: RE | Admit: 2013-05-11 | Discharge: 2013-05-11 | Disposition: A | Discharge status: Home / Self Care | Payer: Medicaid Other | Source: Ambulatory Visit | Attending: Family Medicine | Admitting: Family Medicine

## 2013-05-11 ENCOUNTER — Encounter: Payer: Self-pay | Admitting: Family Medicine

## 2013-05-11 VITALS — BP 111/67 | HR 74 | Temp 99.2°F | Ht <= 58 in | Wt 156.0 lb

## 2013-05-11 DIAGNOSIS — N898 Other specified noninflammatory disorders of vagina: Secondary | ICD-10-CM | POA: Insufficient documentation

## 2013-05-11 DIAGNOSIS — Z113 Encounter for screening for infections with a predominantly sexual mode of transmission: Secondary | ICD-10-CM | POA: Insufficient documentation

## 2013-05-11 DIAGNOSIS — A64 Unspecified sexually transmitted disease: Secondary | ICD-10-CM

## 2013-05-11 LAB — POCT WET PREP (WET MOUNT): Clue Cells Wet Prep Whiff POC: NEGATIVE

## 2013-05-11 NOTE — Progress Notes (Signed)
Family Medicine Office Visit Note   Subjective:   Patient ID: [name], female  DOB: 1975-11-10, 37 y.o.. MRN: [id number]   Pt that comes today for followup. She was diagnosed with Gonorrhea and treated with double therapy including IM Ceftriaxone. She reports has not had sex in this time and her partner was treated as well.  She comes today complaining of vaginal discharge and was concerned if her condition did not resolved with treatment. Denies fever, chills, nausea vomiting, abdomina/pelvic pain, skin rashes or joint problems.  Review of Systems:  Per HPI  Objective:   Physical Exam: Gen:  NAD HEENT: Moist mucous membranes  CV: Regular rate and rhythm, no murmurs rubs or gallops PULM: Clear to auscultation bilaterally. No wheezes/rales/rhonchi ABD: Soft, non tender, non distended, normal bowel sounds Vulva and perianal area: Normal  Speculum: Vagina and cervix of normal appearance, no friability, very mild white thin discharge. Bimanual exam: Uterus anteverted, no adnexal masses. No cervical motion tenderness.  Assessment & Plan:

## 2013-05-11 NOTE — Patient Instructions (Addendum)
Please make and appointment for blood work. I will call you if his labs are abnormal otherwise you will receive a letter with results.  F/u with your primary doctor as needed.

## 2013-05-11 NOTE — Assessment & Plan Note (Signed)
Recently diagnosed and treated for Gonorrhea. Now with vaginal discharge P/ Wet prep, repeated GC and CT HIV and RPR ordered. F/u pending on this results.

## 2013-08-12 ENCOUNTER — Ambulatory Visit (INDEPENDENT_AMBULATORY_CARE_PROVIDER_SITE_OTHER): Payer: Medicaid Other | Admitting: Family Medicine

## 2013-08-12 ENCOUNTER — Other Ambulatory Visit: Payer: Self-pay | Admitting: Family Medicine

## 2013-08-12 VITALS — BP 128/76 | HR 64 | Temp 98.4°F | Ht <= 58 in | Wt 206.4 lb

## 2013-08-12 DIAGNOSIS — Z309 Encounter for contraceptive management, unspecified: Secondary | ICD-10-CM

## 2013-08-12 MED ORDER — FLUCONAZOLE 150 MG PO TABS: 150.0000 mg | ORAL_TABLET | Freq: Once | ORAL | Status: DC

## 2013-08-12 MED ORDER — DESOGESTREL-ETHINYL ESTRADIOL 0.15-30 MG-MCG PO TABS: 1.0000 | ORAL_TABLET | Freq: Every day | ORAL | Status: DC

## 2013-08-12 NOTE — Progress Notes (Signed)
   Subjective:    Patient ID: [name], female    DOB: September 28, 1975, 37 y.o.   MRN: [id number]  HPI  Mirena 63 y  old  Review of Systems     Objective:   Physical Exam PROCEDURE NOTE: IUD removal Patient given informed consent for IUD removal. She is aware this will stop the birth control method provided by the IUD immediately. Informed consent given and signed copy in the chart. Patient placed in the ;ithotomy position and the cervix brought into view using speculum. The IUD strings were identified coming from the cervical os. These strings were grasped with ring forceps, and the IUD withdrawn gently from the uterus. There were no complications and no blood loss. Patient tolerated the procedure well.       Assessment & Plan:  ocp mirena removed No insurance to get new one so condoms and ocps until she gets ins

## 2013-10-05 ENCOUNTER — Encounter (HOSPITAL_COMMUNITY): Payer: Self-pay | Admitting: Emergency Medicine

## 2013-10-05 ENCOUNTER — Emergency Department (HOSPITAL_COMMUNITY)
Admission: EM | Admit: 2013-10-05 | Discharge: 2013-10-05 | Disposition: A | Discharge status: Home / Self Care | Payer: BC Managed Care – PPO | Attending: Emergency Medicine | Admitting: Emergency Medicine

## 2013-10-05 DIAGNOSIS — M5432 Sciatica, left side: Secondary | ICD-10-CM

## 2013-10-05 DIAGNOSIS — S79929A Unspecified injury of unspecified thigh, initial encounter: Secondary | ICD-10-CM

## 2013-10-05 DIAGNOSIS — S79919A Unspecified injury of unspecified hip, initial encounter: Secondary | ICD-10-CM | POA: Insufficient documentation

## 2013-10-05 DIAGNOSIS — S99919A Unspecified injury of unspecified ankle, initial encounter: Principal | ICD-10-CM

## 2013-10-05 DIAGNOSIS — Z79899 Other long term (current) drug therapy: Secondary | ICD-10-CM | POA: Insufficient documentation

## 2013-10-05 DIAGNOSIS — S99929A Unspecified injury of unspecified foot, initial encounter: Principal | ICD-10-CM

## 2013-10-05 DIAGNOSIS — IMO0002 Reserved for concepts with insufficient information to code with codable children: Secondary | ICD-10-CM | POA: Insufficient documentation

## 2013-10-05 DIAGNOSIS — Z792 Long term (current) use of antibiotics: Secondary | ICD-10-CM | POA: Insufficient documentation

## 2013-10-05 DIAGNOSIS — Y9389 Activity, other specified: Secondary | ICD-10-CM | POA: Insufficient documentation

## 2013-10-05 DIAGNOSIS — S8990XA Unspecified injury of unspecified lower leg, initial encounter: Secondary | ICD-10-CM | POA: Insufficient documentation

## 2013-10-05 DIAGNOSIS — Y92410 Unspecified street and highway as the place of occurrence of the external cause: Secondary | ICD-10-CM | POA: Insufficient documentation

## 2013-10-05 MED ORDER — PREDNISONE 10 MG PO TABS: 20.0000 mg | ORAL_TABLET | Freq: Every day | ORAL | Status: DC

## 2013-10-05 MED ORDER — METHOCARBAMOL 500 MG PO TABS: 500.0000 mg | ORAL_TABLET | Freq: Two times a day (BID) | ORAL | Status: DC

## 2013-10-05 MED ORDER — HYDROCODONE-ACETAMINOPHEN 5-325 MG PO TABS: 1.0000 | ORAL_TABLET | ORAL | Status: DC | PRN
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
## 2013-10-10 NOTE — ED Provider Notes (Signed)
Medical screening examination/treatment/procedure(s) were performed by non-physician practitioner and as supervising physician I was immediately available for consultation/collaboration.  EKG Interpretation   None         Kimm Ungaro, MD 10/10/13 0708 

## 2013-12-16 ENCOUNTER — Ambulatory Visit (INDEPENDENT_AMBULATORY_CARE_PROVIDER_SITE_OTHER): Payer: BC Managed Care – PPO | Admitting: Family Medicine

## 2013-12-16 VITALS — BP 119/63 | HR 69 | Temp 98.3°F | Wt 154.4 lb

## 2013-12-16 DIAGNOSIS — Z309 Encounter for contraceptive management, unspecified: Secondary | ICD-10-CM

## 2013-12-16 DIAGNOSIS — Z30430 Encounter for insertion of intrauterine contraceptive device: Secondary | ICD-10-CM

## 2013-12-16 LAB — POCT URINE PREGNANCY: Preg Test, Ur: NEGATIVE

## 2013-12-16 MED ORDER — LEVONORGESTREL 20 MCG/24HR IU IUD
INTRAUTERINE_SYSTEM | Freq: Once | INTRAUTERINE | Status: AC
  Administered 2013-12-16: 1 via INTRAUTERINE

## 2013-12-16 NOTE — Patient Instructions (Signed)
IUD PLACEMENT POST-PROCEDURE INSTRUCTIONS  1. You may take Ibuprofen, Aleve or Tylenol for pain if needed.  Cramping should resolve within in 24 hours.  2. You may have a small amount of spotting.  You should wear a mini pad for the next few days.  3. You may have intercourse after 24 hours.  If you using this for birth control, it is effective immediately.  4. You need to call if you have any pelvic pain, fever, heavy bleeding or foul smelling vaginal discharge.  Irregular bleeding is common the first several months after having an IUD placed. You do not need to call for this reason unless you are concerned.  5. Shower or bathe as normal   

## 2013-12-16 NOTE — Progress Notes (Signed)
Patient ID: [name], female   DOB: 20-Oct-1975, 38 y.o.   MRN: [id number] IUD INSERTION: Patient given informed consent, signed copy in the chart..  Negative pregnancy confirmed.  Appropriate time out taken.   Sterile instruments and technique was used. Cervix brought into view with use of speculum and then cleansed three times with  betadine swabs.  A tenaculum was placed into the anterior lip of the cervix and a uterine sound was used to measure uterine size. 6 cm   A Mirena IUD was placed into the endometrial cavity, deployed and secured. The applicator was removed. The strings were trimmed to 2 centimeters.   There were no complications and the patient tolerated the procedure well.   She was given handouts for post procedure instructions and information about the IUD including a card with the time of recommended removal.

## 2014-04-04 ENCOUNTER — Ambulatory Visit (INDEPENDENT_AMBULATORY_CARE_PROVIDER_SITE_OTHER): Payer: Self-pay | Admitting: Family Medicine

## 2014-04-04 ENCOUNTER — Other Ambulatory Visit (HOSPITAL_COMMUNITY)
Admission: RE | Admit: 2014-04-04 | Discharge: 2014-04-04 | Disposition: A | Discharge status: Home / Self Care | Payer: No Typology Code available for payment source | Source: Ambulatory Visit | Attending: Family Medicine | Admitting: Family Medicine

## 2014-04-04 VITALS — BP 149/90 | HR 70 | Temp 97.8°F | Wt 152.8 lb

## 2014-04-04 DIAGNOSIS — Z202 Contact with and (suspected) exposure to infections with a predominantly sexual mode of transmission: Secondary | ICD-10-CM

## 2014-04-04 DIAGNOSIS — Z113 Encounter for screening for infections with a predominantly sexual mode of transmission: Secondary | ICD-10-CM | POA: Insufficient documentation

## 2014-04-04 DIAGNOSIS — Z711 Person with feared health complaint in whom no diagnosis is made: Secondary | ICD-10-CM

## 2014-04-04 NOTE — Progress Notes (Signed)
   Subjective:    Patient ID: [name], female    DOB: January 29, 1976, 38 y.o.   MRN: [id number]  HPI 38 y/o female presents for evaluation of possible STD, received a phone call from partner 2 days ago stating he was having "genital issues", he was treated for UTI however unclear if also treated for STD, last intercourse with this partner was 3 weeks with condom  She has 2 female partners, uses condoms every time, has an IUD  No vaginal discharge/irritation, no fevers/chills, no dysuria   Review of Systems See above.     Objective:   Physical Exam Vitals: reviewed Gen: pleasant AAF, NAD GU: chaperone present, normal external female anatomy     Assessment & Plan:  Please see problem specific assessment and plan.

## 2014-04-04 NOTE — Assessment & Plan Note (Signed)
Patient had possible exposure to STD. No active symptoms.  -obtain urine GC/Chlam -check HIV/RPR

## 2014-04-04 NOTE — Patient Instructions (Signed)
Dr. Hester Forget will call you with your results. 

## 2014-04-05 ENCOUNTER — Telehealth: Payer: Self-pay | Admitting: Family Medicine

## 2014-04-05 LAB — HIV ANTIBODY (ROUTINE TESTING W REFLEX): HIV 1&2 Ab, 4th Generation: NONREACTIVE

## 2014-04-05 LAB — RPR

## 2014-04-05 NOTE — Telephone Encounter (Signed)
Informed patient of negative test results.

## 2014-06-21 ENCOUNTER — Ambulatory Visit (INDEPENDENT_AMBULATORY_CARE_PROVIDER_SITE_OTHER): Payer: Self-pay | Admitting: Family Medicine

## 2014-06-21 ENCOUNTER — Encounter: Payer: Self-pay | Admitting: Family Medicine

## 2014-06-21 ENCOUNTER — Other Ambulatory Visit (HOSPITAL_COMMUNITY)
Admission: RE | Admit: 2014-06-21 | Discharge: 2014-06-21 | Disposition: A | Discharge status: Home / Self Care | Payer: No Typology Code available for payment source | Source: Ambulatory Visit | Attending: Family Medicine | Admitting: Family Medicine

## 2014-06-21 VITALS — BP 120/71 | HR 66 | Temp 98.5°F | Resp 18 | Wt 162.0 lb

## 2014-06-21 DIAGNOSIS — Z113 Encounter for screening for infections with a predominantly sexual mode of transmission: Secondary | ICD-10-CM | POA: Insufficient documentation

## 2014-06-21 DIAGNOSIS — N898 Other specified noninflammatory disorders of vagina: Secondary | ICD-10-CM

## 2014-06-21 LAB — POCT URINALYSIS DIPSTICK
Bilirubin, UA: NEGATIVE
Glucose, UA: NEGATIVE
Nitrite, UA: NEGATIVE
Protein, UA: NEGATIVE
SPEC GRAV UA: 1.02
Urobilinogen, UA: 2
pH, UA: 7

## 2014-06-21 LAB — POCT UA - MICROSCOPIC ONLY

## 2014-06-21 LAB — POCT WET PREP (WET MOUNT): CLUE CELLS WET PREP WHIFF POC: NEGATIVE

## 2014-06-21 LAB — POCT URINE PREGNANCY: Preg Test, Ur: NEGATIVE

## 2014-06-21 NOTE — Progress Notes (Signed)
    Subjective   [name] is a 38 y.o. female that presents for a same day visit  VAGINAL DISCHARGE  Onset: three days   Description: white  Odor: yes  Itching: yes  Symptoms Dysuria: no  Bleeding: no  Pelvic pain: no  Back pain: no  Fever: no  Genital sores: no  Rash: no  Dyspareunia: no  GI Sxs: no  Prior treatment: yes, treated for BV and yeast Red Flags: Missed period: no  Pregnancy: no  Recent antibiotics: no  Sexual activity: no  Possible STD exposure: doesn't use protection and doesn't know STI status of partner   IUD: yes  Diabetes: no     History  Substance Use Topics  . Smoking status: Never Smoker   . Smokeless tobacco: Not on file  . Alcohol Use: Yes     Comment: occ    ROS Per HPI  Objective   BP 120/71  Pulse 66  Temp(Src) 98.5 F (36.9 C) (Oral)  Resp 18  Wt 162 lb (73.483 kg)  SpO2 99%  General: well appearing, NAD, alert   Genitourinary:  > External: no lesions > Vagina: no blood in vault > Cervix: no lesion; no mucopurulent d/c;        Assessment and Plan   Please refer to problem based charting of assessment and plan

## 2014-06-21 NOTE — Patient Instructions (Signed)
Thank you for coming in,   We will call you with the results and treat accordingly.   Please follow up with Dr. Lonny Prude if you have any other problems.    Please feel free to call with any questions or concerns at any time, at [phone]. --Dr. Raeford Razor

## 2014-06-22 ENCOUNTER — Encounter: Payer: Self-pay | Admitting: Family Medicine

## 2014-06-22 ENCOUNTER — Telehealth: Payer: Self-pay | Admitting: Family Medicine

## 2014-06-22 LAB — CERVICOVAGINAL ANCILLARY ONLY
Chlamydia: NEGATIVE
Neisseria Gonorrhea: NEGATIVE

## 2014-06-22 NOTE — Telephone Encounter (Signed)
Pt called and would like to know her test results. jw °

## 2014-06-22 NOTE — Assessment & Plan Note (Signed)
Symptoms of discharge - wet prep today.  - will treat on results.

## 2014-06-23 NOTE — Telephone Encounter (Signed)
Ms Stapleton called back to say she didn't understand the lab results on "My Chart".  Please call her back to discuss

## 2014-06-23 NOTE — Telephone Encounter (Signed)
Results no showing any vaginitis or UTI. Please call patient and inform. Thanks.

## 2014-06-24 ENCOUNTER — Telehealth: Payer: Self-pay | Admitting: Family Medicine

## 2014-06-24 DIAGNOSIS — N898 Other specified noninflammatory disorders of vagina: Secondary | ICD-10-CM

## 2014-06-24 NOTE — Telephone Encounter (Signed)
Patient is still having symptoms

## 2014-06-26 MED ORDER — FLUCONAZOLE 150 MG PO TABS: 150.0000 mg | ORAL_TABLET | Freq: Once | ORAL | Status: DC

## 2014-06-26 NOTE — Telephone Encounter (Signed)
Patient still symptomatic, will treat empirically.   Rosemarie Ax, MD PGY-2, [facility] 06/26/2014, 11:04 AM

## 2014-06-27 NOTE — Telephone Encounter (Signed)
Spoke with patient and informed her of below 

## 2015-01-31 ENCOUNTER — Ambulatory Visit: Payer: Self-pay | Admitting: Family Medicine

## 2015-02-01 ENCOUNTER — Encounter: Payer: Self-pay | Admitting: Family Medicine

## 2015-02-01 ENCOUNTER — Other Ambulatory Visit (HOSPITAL_COMMUNITY)
Admission: RE | Admit: 2015-02-01 | Discharge: 2015-02-01 | Disposition: A | Discharge status: Home / Self Care | Payer: 59 | Source: Ambulatory Visit | Attending: Family Medicine | Admitting: Family Medicine

## 2015-02-01 ENCOUNTER — Ambulatory Visit (INDEPENDENT_AMBULATORY_CARE_PROVIDER_SITE_OTHER): Payer: 59 | Admitting: Family Medicine

## 2015-02-01 ENCOUNTER — Telehealth: Payer: Self-pay | Admitting: Family Medicine

## 2015-02-01 VITALS — BP 128/69 | HR 62 | Temp 98.3°F | Wt 165.0 lb

## 2015-02-01 DIAGNOSIS — Z1151 Encounter for screening for human papillomavirus (HPV): Secondary | ICD-10-CM | POA: Diagnosis present

## 2015-02-01 DIAGNOSIS — Z113 Encounter for screening for infections with a predominantly sexual mode of transmission: Secondary | ICD-10-CM | POA: Insufficient documentation

## 2015-02-01 DIAGNOSIS — L298 Other pruritus: Secondary | ICD-10-CM

## 2015-02-01 DIAGNOSIS — Z01419 Encounter for gynecological examination (general) (routine) without abnormal findings: Secondary | ICD-10-CM | POA: Insufficient documentation

## 2015-02-01 DIAGNOSIS — Z124 Encounter for screening for malignant neoplasm of cervix: Secondary | ICD-10-CM | POA: Diagnosis not present

## 2015-02-01 DIAGNOSIS — N898 Other specified noninflammatory disorders of vagina: Secondary | ICD-10-CM | POA: Diagnosis not present

## 2015-02-01 LAB — POCT WET PREP (WET MOUNT): CLUE CELLS WET PREP WHIFF POC: POSITIVE

## 2015-02-01 LAB — CERVICOVAGINAL ANCILLARY ONLY
Chlamydia: NEGATIVE
Neisseria Gonorrhea: NEGATIVE

## 2015-02-01 MED ORDER — METRONIDAZOLE 0.75 % VA GEL: 1.0000 | Freq: Two times a day (BID) | VAGINAL | Status: DC

## 2015-02-01 MED ORDER — FLUCONAZOLE 150 MG PO TABS: 150.0000 mg | ORAL_TABLET | Freq: Once | ORAL | Status: DC

## 2015-02-01 NOTE — Progress Notes (Signed)
Patient ID: [name], female   DOB: 1976/04/10, 39 y.o.   MRN: [id number]   HPI  Patient presents today for sda  For vaginal discharge  Patient's when she's had one week of thick white foul-smelling vaginal discharge. She states that also has a foul smell. She denies any new partners in the last 6 months, she is sexually active and does not use condoms, she's had one partner in the last 6 months.  She uses an IUD for contraception.  She denies other vaginal issues, fever, chills, and change in appetite.  Smoking status noted - never ROS: Per HPI  Objective: BP 128/69 mmHg  Pulse 62  Temp(Src) 98.3 F (36.8 C) (Oral)  Wt 165 lb (74.844 kg) Gen: NAD, alert, cooperative with exam HEENT: NCAT, TMs WNL BL, nares and slight swelling of the turbinates bilaterally  CV: RRR, good S1/S2, no murmur Resp: CTABL, no wheezes, non-labored Ext: No edema, warm Neuro: Alert and oriented, No gross deficits  Vaginal exam Normal-appearing cervix, small amount of thick white discharge, no cervical motion tenderness or adnexal fullness   Assessment and plan:  Vaginal discharge Vaginal DC c/w yeast +/- BV Pap done as well today [facility] Wet prep shows bacterial vaginosis also some yeast Treat with MetroGel and Diflucan - called and discussed this with patient over the phone.     Orders Placed This Encounter  Procedures  . POCT Wet Prep [facility])    Meds ordered this encounter  Medications  . fluconazole (DIFLUCAN) 150 MG tablet    Sig: Take 1 tablet (150 mg total) by mouth once. Repeat in 3 days if not better.    Dispense:  2 tablet    Refill:  0  . metroNIDAZOLE (METROGEL) 0.75 % vaginal gel    Sig: Place 1 Applicatorful vaginally 2 (two) times daily. For 5 days    Dispense:  70 g    Refill:  0

## 2015-02-01 NOTE — Telephone Encounter (Signed)
Called to discuss results, no answer- will attempt again.   Laroy Apple, MD [place] Resident, PGY-3 02/01/2015, 9:16 AM

## 2015-02-01 NOTE — Assessment & Plan Note (Addendum)
Vaginal DC c/w yeast +/- BV Pap done as well today [facility] Wet prep shows bacterial vaginosis also some yeast Treat with MetroGel and Diflucan - called and discussed this with patient over the phone.

## 2015-02-01 NOTE — Patient Instructions (Signed)
Great to meet you!  I will call you later with the results  Bacterial Vaginosis Bacterial vaginosis is a vaginal infection that occurs when the normal balance of bacteria in the vagina is disrupted. It results from an overgrowth of certain bacteria. This is the most common vaginal infection in women of childbearing age. Treatment is important to prevent complications, especially in pregnant women, as it can cause a premature delivery. CAUSES  Bacterial vaginosis is caused by an increase in harmful bacteria that are normally present in smaller amounts in the vagina. Several different kinds of bacteria can cause bacterial vaginosis. However, the reason that the condition develops is not fully understood. RISK FACTORS Certain activities or behaviors can put you at an increased risk of developing bacterial vaginosis, including:  Having a new sex partner or multiple sex partners.  Douching.  Using an intrauterine device (IUD) for contraception. Women do not get bacterial vaginosis from toilet seats, bedding, swimming pools, or contact with objects around them. SIGNS AND SYMPTOMS  Some women with bacterial vaginosis have no signs or symptoms. Common symptoms include:  Grey vaginal discharge.  A fishlike odor with discharge, especially after sexual intercourse.  Itching or burning of the vagina and vulva.  Burning or pain with urination. DIAGNOSIS  Your health care provider will take a medical history and examine the vagina for signs of bacterial vaginosis. A sample of vaginal fluid may be taken. Your health care provider will look at this sample under a microscope to check for bacteria and abnormal cells. A vaginal pH test may also be done.  TREATMENT  Bacterial vaginosis may be treated with antibiotic medicines. These may be given in the form of a pill or a vaginal cream. A second round of antibiotics may be prescribed if the condition comes back after treatment.  HOME CARE INSTRUCTIONS    Only take over-the-counter or prescription medicines as directed by your health care provider.  If antibiotic medicine was prescribed, take it as directed. Make sure you finish it even if you start to feel better.  Do not have sex until treatment is completed.  Tell all sexual partners that you have a vaginal infection. They should see their health care provider and be treated if they have problems, such as a mild rash or itching.  Practice safe sex by using condoms and only having one sex partner. SEEK MEDICAL CARE IF:   Your symptoms are not improving after 3 days of treatment.  You have increased discharge or pain.  You have a fever. MAKE SURE YOU:   Understand these instructions.  Will watch your condition.  Will get help right away if you are not doing well or get worse. FOR MORE INFORMATION  Centers for Disease Control and Prevention, Division of STD Prevention: [employer]raiser[employer]raud[employer] American Sexual Health Association (ASHA): www.ashastd.org  Document Released: 08/12/2005 Document Revised: 06/02/2013 Document Reviewed: 03/24/2013 [facility] Patient Information 2015 [place], [place]. This information is not intended to replace advice given to you by your health care provider. Make sure you discuss any questions you have with your health care provider.

## 2015-02-02 ENCOUNTER — Telehealth: Payer: Self-pay | Admitting: Family Medicine

## 2015-02-02 NOTE — Telephone Encounter (Signed)
Pt stated she was being treated for yeast and BV.  Pt advised to take both medications together.  Pt stated understanding.  Derl Barrow, RN

## 2015-02-02 NOTE — Telephone Encounter (Signed)
Given two Rx, Diflucan and metroNIDAZOLE (METROGEL), and she was wondering if she should wait until after she finishes the gel to take the pill or not. Please advise, thank you, Fonda Kinder, ASA

## 2015-02-03 LAB — CYTOLOGY - PAP

## 2015-02-06 ENCOUNTER — Encounter: Payer: Self-pay | Admitting: Family Medicine

## 2015-05-19 ENCOUNTER — Other Ambulatory Visit (HOSPITAL_COMMUNITY)
Admission: RE | Admit: 2015-05-19 | Discharge: 2015-05-19 | Disposition: A | Discharge status: Home / Self Care | Payer: 59 | Source: Ambulatory Visit | Attending: Family Medicine | Admitting: Family Medicine

## 2015-05-19 ENCOUNTER — Ambulatory Visit (INDEPENDENT_AMBULATORY_CARE_PROVIDER_SITE_OTHER): Payer: 59 | Admitting: Family Medicine

## 2015-05-19 VITALS — BP 139/82 | HR 79 | Temp 98.8°F | Wt 169.6 lb

## 2015-05-19 DIAGNOSIS — Z113 Encounter for screening for infections with a predominantly sexual mode of transmission: Secondary | ICD-10-CM | POA: Insufficient documentation

## 2015-05-19 DIAGNOSIS — N898 Other specified noninflammatory disorders of vagina: Secondary | ICD-10-CM

## 2015-05-19 DIAGNOSIS — R300 Dysuria: Secondary | ICD-10-CM

## 2015-05-19 LAB — POCT URINALYSIS DIPSTICK
Bilirubin, UA: NEGATIVE
Blood, UA: NEGATIVE
GLUCOSE UA: NEGATIVE
Ketones, UA: NEGATIVE
Leukocytes, UA: NEGATIVE
NITRITE UA: NEGATIVE
PROTEIN UA: NEGATIVE
SPEC GRAV UA: 1.015
Urobilinogen, UA: 1
pH, UA: 8

## 2015-05-19 LAB — POCT WET PREP (WET MOUNT): CLUE CELLS WET PREP WHIFF POC: NEGATIVE

## 2015-05-19 NOTE — Progress Notes (Signed)
   Subjective:    Patient ID: [name], female    DOB: 04/04/1976, 39 y.o.   MRN: [id number]  Seen for Same day visit for   CC: vaginal discharge.   Hx of recurrent BV.  Has been given metrogel previously  She has IUD for contraception  Took a diflucan yesterday which seemed to help some.  Onset: 7 days ago.   Description: white  Odor: no   Itching: yes  Symptoms Dysuria: no  Bleeding: no  Pelvic pain: no  Back pain: no  Fever: no  Genital sores: no  Rash: no  Dyspareunia: no  GI Sxs: no  Prior treatment: yes   Red Flags: Missed period: no  Pregnancy: no  Recent antibiotics: no  Sexual activity: no  Possible STD exposure: no  IUD: yes  Diabetes: no   Review of Systems   See HPI for ROS. Objective:  BP 139/82 mmHg  Pulse 79  Temp(Src) 98.8 F (37.1 C) (Oral)  Wt 169 lb 9.6 oz (76.93 kg)  General: NAD GU: > External: no lesions > Vagina: no blood in vault > Cervix: no lesion;        Assessment & Plan:   Vaginal discharge Wet prep, GC/ch, and UA performed.  Wet prep c/w clue cells.  Has a hx of recurrent BV.  - metro gel will be sent.

## 2015-05-19 NOTE — Patient Instructions (Signed)
Thank you for coming in,   I will call in a medication if necessary based on your results.   Sign up for My Chart to have easy access to your labs results, and communication with your Primary care physician   Please feel free to call with any questions or concerns at any time, at [phone]. --Dr. Raeford Razor

## 2015-05-22 LAB — CERVICOVAGINAL ANCILLARY ONLY
CHLAMYDIA, DNA PROBE: NEGATIVE
Neisseria Gonorrhea: NEGATIVE

## 2015-05-22 MED ORDER — METRONIDAZOLE 0.75 % VA GEL: 1.0000 | Freq: Two times a day (BID) | VAGINAL | Status: DC

## 2015-05-22 NOTE — Assessment & Plan Note (Signed)
Wet prep, GC/ch, and UA performed.  Wet prep c/w clue cells.  Has a hx of recurrent BV.  - metro gel will be sent.

## 2016-07-15 ENCOUNTER — Encounter: Payer: Self-pay | Admitting: Internal Medicine

## 2016-07-22 ENCOUNTER — Other Ambulatory Visit: Payer: Self-pay | Admitting: Internal Medicine

## 2016-07-22 ENCOUNTER — Ambulatory Visit (INDEPENDENT_AMBULATORY_CARE_PROVIDER_SITE_OTHER): Payer: Medicaid Other | Admitting: Internal Medicine

## 2016-07-22 ENCOUNTER — Encounter: Payer: Self-pay | Admitting: Internal Medicine

## 2016-07-22 VITALS — BP 124/68 | HR 72 | Temp 98.4°F | Wt 173.4 lb

## 2016-07-22 DIAGNOSIS — Z23 Encounter for immunization: Secondary | ICD-10-CM

## 2016-07-22 DIAGNOSIS — M25562 Pain in left knee: Secondary | ICD-10-CM | POA: Insufficient documentation

## 2016-07-22 DIAGNOSIS — H9190 Unspecified hearing loss, unspecified ear: Secondary | ICD-10-CM | POA: Diagnosis present

## 2016-07-22 DIAGNOSIS — G8929 Other chronic pain: Secondary | ICD-10-CM | POA: Diagnosis not present

## 2016-07-22 DIAGNOSIS — E669 Obesity, unspecified: Secondary | ICD-10-CM | POA: Diagnosis not present

## 2016-07-22 DIAGNOSIS — Z111 Encounter for screening for respiratory tuberculosis: Secondary | ICD-10-CM

## 2016-07-22 DIAGNOSIS — Z6838 Body mass index (BMI) 38.0-38.9, adult: Secondary | ICD-10-CM | POA: Diagnosis not present

## 2016-07-22 DIAGNOSIS — M25511 Pain in right shoulder: Secondary | ICD-10-CM | POA: Diagnosis not present

## 2016-07-22 LAB — BASIC METABOLIC PANEL
BUN: 7 mg/dL (ref 7–25)
CHLORIDE: 102 mmol/L (ref 98–110)
CO2: 30 mmol/L (ref 20–31)
Calcium: 9.5 mg/dL (ref 8.6–10.2)
Creat: 0.86 mg/dL (ref 0.50–1.10)
Glucose, Bld: 96 mg/dL (ref 65–99)
POTASSIUM: 4.1 mmol/L (ref 3.5–5.3)
Sodium: 141 mmol/L (ref 135–146)

## 2016-07-22 NOTE — Assessment & Plan Note (Signed)
Chronic. 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 Will obtain x-ray to evaluate further and determine management based on results.

## 2016-07-22 NOTE — Assessment & Plan Note (Signed)
Patient with known hearing loss. Evidence of hearing loss on today's exam. Will refer to audiology for further evaluation.

## 2016-07-22 NOTE — Patient Instructions (Signed)
Please go to  imaging for your x-rays. I will call you with the results.   I have placed a referral for audiology for your hearing. You will get a call about this appointment.

## 2016-07-22 NOTE — Assessment & Plan Note (Signed)
Exam concerning for rotator cuff pathology. Will obtain xray to rule out bony process. Discussed with patient that management of rotator cuff process would likely consistent of steroid injection. Patient was hesitant about that. If x-ray negative and still does not want injection will manage conservatively.
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
## 2016-07-25 ENCOUNTER — Ambulatory Visit
Admission: RE | Admit: 2016-07-25 | Discharge: 2016-07-25 | Disposition: A | Discharge status: Home / Self Care | Payer: Medicaid Other | Source: Ambulatory Visit | Attending: Family Medicine | Admitting: Family Medicine

## 2016-07-25 DIAGNOSIS — M25562 Pain in left knee: Secondary | ICD-10-CM

## 2016-07-25 DIAGNOSIS — G8929 Other chronic pain: Secondary | ICD-10-CM

## 2016-07-25 DIAGNOSIS — M25511 Pain in right shoulder: Secondary | ICD-10-CM

## 2016-07-26 ENCOUNTER — Telehealth: Payer: Self-pay | Admitting: Internal Medicine

## 2016-07-26 ENCOUNTER — Telehealth: Payer: Self-pay

## 2016-07-26 DIAGNOSIS — M67911 Unspecified disorder of synovium and tendon, right shoulder: Secondary | ICD-10-CM

## 2016-07-26 DIAGNOSIS — M1712 Unilateral primary osteoarthritis, left knee: Secondary | ICD-10-CM

## 2016-07-26 NOTE — Telephone Encounter (Signed)
Pt informed. Sharon T Saunders, CMA  

## 2016-07-26 NOTE — Telephone Encounter (Signed)
Called to discuss X-ray results with patient.   Given lateral tracking of patella seen on x-ray would likely benefit from strengthening of VMO.   Would also likely benefit from corticosteroid injections of both knee and shoulder. Patient is hesitant to have injections and would like to think about it further.   In the meantime will place PT order (one time visit due to [facility]) so that patient can learn exercises to help with rotator cuff and knee arthritis.   Phill Myron, D.O. 07/26/2016, 4:33 PM PGY-2, [address]

## 2016-07-26 NOTE — Telephone Encounter (Signed)
-----   Message from Nicolette Bang, DO sent at 07/24/2016  2:42 PM EST ----- Please call patient and let her know that her labs were normal.

## 2016-08-06 ENCOUNTER — Ambulatory Visit: Payer: Medicaid Other | Admitting: Physical Therapy

## 2016-08-15 ENCOUNTER — Encounter: Payer: Self-pay | Admitting: Physical Therapy

## 2016-08-15 ENCOUNTER — Ambulatory Visit: Payer: Medicaid Other | Attending: Nurse Practitioner | Admitting: Physical Therapy

## 2016-08-15 DIAGNOSIS — M25562 Pain in left knee: Secondary | ICD-10-CM | POA: Diagnosis present

## 2016-08-15 DIAGNOSIS — R262 Difficulty in walking, not elsewhere classified: Secondary | ICD-10-CM

## 2016-08-15 DIAGNOSIS — M67911 Unspecified disorder of synovium and tendon, right shoulder: Secondary | ICD-10-CM | POA: Diagnosis present

## 2016-08-15 DIAGNOSIS — M6281 Muscle weakness (generalized): Secondary | ICD-10-CM | POA: Insufficient documentation

## 2016-08-15 DIAGNOSIS — M1712 Unilateral primary osteoarthritis, left knee: Secondary | ICD-10-CM | POA: Insufficient documentation

## 2016-08-15 DIAGNOSIS — G8929 Other chronic pain: Secondary | ICD-10-CM | POA: Insufficient documentation
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
## 2016-09-25 ENCOUNTER — Other Ambulatory Visit (HOSPITAL_COMMUNITY)
Admission: RE | Admit: 2016-09-25 | Discharge: 2016-09-25 | Disposition: A | Discharge status: Home / Self Care | Payer: Medicaid Other | Source: Ambulatory Visit | Attending: Family Medicine | Admitting: Family Medicine

## 2016-09-25 ENCOUNTER — Ambulatory Visit (INDEPENDENT_AMBULATORY_CARE_PROVIDER_SITE_OTHER): Payer: Medicaid Other | Admitting: Student

## 2016-09-25 VITALS — BP 128/74 | HR 64 | Temp 98.1°F | Ht <= 58 in | Wt 176.2 lb

## 2016-09-25 DIAGNOSIS — T8332XA Displacement of intrauterine contraceptive device, initial encounter: Secondary | ICD-10-CM | POA: Diagnosis not present

## 2016-09-25 DIAGNOSIS — IMO0001 Reserved for inherently not codable concepts without codable children: Secondary | ICD-10-CM

## 2016-09-25 DIAGNOSIS — R300 Dysuria: Secondary | ICD-10-CM | POA: Diagnosis not present

## 2016-09-25 DIAGNOSIS — N898 Other specified noninflammatory disorders of vagina: Secondary | ICD-10-CM | POA: Diagnosis present

## 2016-09-25 DIAGNOSIS — Z113 Encounter for screening for infections with a predominantly sexual mode of transmission: Secondary | ICD-10-CM | POA: Insufficient documentation

## 2016-09-25 DIAGNOSIS — Z309 Encounter for contraceptive management, unspecified: Secondary | ICD-10-CM | POA: Insufficient documentation

## 2016-09-25 LAB — POCT URINALYSIS DIPSTICK
Bilirubin, UA: NEGATIVE
Glucose, UA: NEGATIVE
Ketones, UA: NEGATIVE
Leukocytes, UA: NEGATIVE
NITRITE UA: NEGATIVE
PH UA: 6
Protein, UA: NEGATIVE
RBC UA: NEGATIVE
Spec Grav, UA: 1.02
Urobilinogen, UA: 1

## 2016-09-25 LAB — POCT URINE PREGNANCY: Preg Test, Ur: NEGATIVE

## 2016-09-25 MED ORDER — METRONIDAZOLE 0.75 % VA GEL: 1.0000 | Freq: Two times a day (BID) | VAGINAL | 0 refills | Status: AC

## 2016-09-25 NOTE — Assessment & Plan Note (Signed)
Given initial concern for dysuria, UA collected - will treat as needed

## 2016-09-25 NOTE — Assessment & Plan Note (Addendum)
No red flag symptoms on exam - wet prep performed- + BV will treat with metrogel - GC/CT collected, will treat as needed

## 2016-09-25 NOTE — Assessment & Plan Note (Addendum)
IUD placed in 11/2013 at the [facility], strings not visualized today - will send for abdominal US to confirm placement - Upreg today was negative - patient to use back up contraception until IUD placement confirmed

## 2016-09-25 NOTE — Progress Notes (Signed)
   Subjective:    Patient ID: [name], female    DOB: Dec 09, 1975, 41 y.o.   MRN: [id number]   CC: vaginal odor  HPI: 41 y/o F who presents for vaginal odor  Vaginal odor - denies vaginal irritation, itching or discharge - started 3 days ago - denies abdominal pain, fevers - is sexually active with one female partner - reports IUD in place  Dysuria - reported dysuria to the nurse but denies it to the provider - no abdominal pain - no fevers - has chronic lower back pain but it has remained unchanged   Smoking status reviewed  Review of Systems  Per HPI, else denies chest pain, N/V/D   Objective:  BP 128/74   Pulse 64   Temp 98.1 F (36.7 C) (Oral)   Ht 4\' 10"  (1.473 m)   Wt 176 lb 3.2 oz (79.9 kg)   SpO2 97%   BMI 36.83 kg/m  Vitals and nursing note reviewed  General: NAD Cardiac: RRR Respiratory: CTAB, normal effort Abdomen: soft, nontender, nondistended,  Bowel sounds present MSK: no CVA tenderness Skin: warm and dry, no rashes noted Neuro: alert and oriented Pelvic: Normal EGBUS, normal vaginal, normal cervix with no CMT,  IUD STRINGS NOT VISUALIZED and unable to be pulled from the cervix with a cytobrush, normal mobile uterus, normal adnexa with no masses, no adnexal tenderness, exam limited by body habitus    Assessment & Plan:    Contraception IUD placed in 11/2013 at the [facility], strings not visualized today - will send for abdominal US to confirm placement - Upreg today was negative - patient to use back up contraception until IUD placement confirmed  Vaginal odor No red flag symptoms on exam - wet prep performed- + BV will treat with metrogel - GC/CT collected, will treat as needed  Dysuria Given initial concern for dysuria, UA collected - will treat as needed    Trayon Krantz A. Lincoln Brigham MD, [facility] Resident PGY-3 Pager [phone]

## 2016-09-25 NOTE — Patient Instructions (Signed)
You were seen for vaginal odor - you will be called regarding your results and treated as needed  We could not see your IUD strings so an ultrasound of your uterus will be performed PLEASE USE CONDOMS FOR EACH EPISODE OF SEXUAL INTERCOURSE YOU HAVE [place] IUD IS IN PLACE Your pregnancy test today was negative  If you have any questions or concerns, call the office at [phone]

## 2016-09-26 LAB — CERVICOVAGINAL ANCILLARY ONLY
CHLAMYDIA, DNA PROBE: NEGATIVE
Neisseria Gonorrhea: NEGATIVE

## 2016-10-07 ENCOUNTER — Telehealth: Payer: Self-pay | Admitting: Student

## 2016-10-07 ENCOUNTER — Ambulatory Visit (HOSPITAL_COMMUNITY)
Admission: RE | Admit: 2016-10-07 | Discharge: 2016-10-07 | Disposition: A | Discharge status: Home / Self Care | Payer: Medicaid Other | Source: Ambulatory Visit | Attending: Family Medicine | Admitting: Family Medicine

## 2016-10-07 ENCOUNTER — Encounter: Payer: Self-pay | Admitting: Student

## 2016-10-07 DIAGNOSIS — Z30430 Encounter for insertion of intrauterine contraceptive device: Secondary | ICD-10-CM | POA: Insufficient documentation

## 2016-10-07 DIAGNOSIS — X58XXXA Exposure to other specified factors, initial encounter: Secondary | ICD-10-CM | POA: Insufficient documentation

## 2016-10-07 DIAGNOSIS — T8332XA Displacement of intrauterine contraceptive device, initial encounter: Secondary | ICD-10-CM | POA: Diagnosis not present

## 2016-10-07 DIAGNOSIS — D250 Submucous leiomyoma of uterus: Secondary | ICD-10-CM | POA: Diagnosis not present

## 2016-10-07 NOTE — Telephone Encounter (Signed)
Called pt to inform- pt did not answer and no option for VM was available. If pt calls back please inform her of IUD placement.

## 2016-10-07 NOTE — Telephone Encounter (Signed)
Please call the patient and inform her that her IUD is in place in good position. Thanks!

## 2016-10-09 ENCOUNTER — Ambulatory Visit: Payer: Medicaid Other | Attending: Nurse Practitioner | Admitting: Audiology

## 2016-10-09 DIAGNOSIS — H93213 Auditory recruitment, bilateral: Secondary | ICD-10-CM

## 2016-10-09 DIAGNOSIS — R94128 Abnormal results of other function studies of ear and other special senses: Secondary | ICD-10-CM | POA: Diagnosis present

## 2016-10-09 DIAGNOSIS — H93299 Other abnormal auditory perceptions, unspecified ear: Secondary | ICD-10-CM | POA: Diagnosis present

## 2016-10-09 DIAGNOSIS — H903 Sensorineural hearing loss, bilateral: Secondary | ICD-10-CM | POA: Diagnosis present

## 2016-10-09 DIAGNOSIS — H9193 Unspecified hearing loss, bilateral: Secondary | ICD-10-CM | POA: Insufficient documentation

## 2016-10-09 DIAGNOSIS — Z01118 Encounter for examination of ears and hearing with other abnormal findings: Secondary | ICD-10-CM | POA: Diagnosis present

## 2016-10-09 NOTE — Procedures (Signed)
Outpatient Audiology and [place]  [place], [place] [address]  [phone]   Audiological Evaluation  Patient Name: [name]   Status: Outpatient   DOB: 25-Jul-1976    Diagnosis: Hearing Loss MRN: [id number] Date:  10/09/2016     Referent: Melina Schools, DO  History: [name] was seen for an audiological evaluation. [name] states that she "was born with some hearing loss but it has gotten worse over the years".  [name] states that she was fit with "a hearing aid in the 3rd grade" and that she wore the hearing aid into high school, but "quit wearing it".   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   Primary Concern: "hearing to get along in life" History of hearing problems: Y   Had speech tehrapy from K-3rd grade. History of ear surgery or "tubes" : N Family history of hearing loss: N Medications: None    Evaluation: Conventional pure tone audiometry from 250Hz  - 8000Hz  with using insert earphones. Hearing thresholds are symmetrical with the left ear 5-10 dBHL poorer at most frequencies.  Hearing thresholds are 55 dBHL at 250Hz ; 50-60 dBHL at 500Hz ; 45-55 dBHL at 1000Hz ; 65-75 dBHL at 2000Hz -4000Hz  and 75-80 dbHL at 8000Hz . The hearing loss is sensorineural bilaterally with recruitment. Reliability is good Speech reception levels (repeating words near threshold) using recorded spondee word lists is 45 dBHL on the right side.  The left side was unable to recognize any words, even at loud levels with speech detection threshold of 55 dBHL using recorded multitalker noise.  Word recognition (at comfortably loud volumes) using recorded PBK word lists, in quiet.  Right ear: 88% at 90 dBHL.  Left ear:   0% at 80dBHL (Most Comfortable Listening Level) Tympanometry (middle ear function) with ipsilateral acoustic reflexes of 90 dBHL at 500Hz  and no response from 1000Hz   - 4000Hz .   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  This amount of hearing loss adversely affects speech communication at normal conversational speech levels - [name] is aware that she primarily "lipreads". Word recognition is good in the right ear.  The left ear was unable to repeat any PBK words correctly even at loud levels. [name] states that the left ear "hears but doesn't understand".    Middle ear pressure is within normal limits with acoustic reflexes as would be expected for this degree of hearing loss.  [name] is referred to [facility] with Lonzo Candy, AuD for a hearing aid evaluation. This company participated with some special hearing aid funding.  In addition, [name] was encouraged to investigate hearing aid funding through [place] (Telephone [phone]) since she is seeking to hear better for employment purposes.    The test results were discussed and [name] counseled.  With her permission, information was forwarded to [employer] to investigate captioned and amplified telephone options that may be available through [employer] funding.   RECOMMENDATIONS: 1.   Appointment made for 10/23/2016 at 1:30 pm with Dr. Lonzo Candy, audiologist [facility] for hearing aid consultation.   2.   Consider further evaluation by an Ear, Nose and Throat physician - [name] reports longstanding poor word recognition on left side, even when  loud enough.  She only remembers being seen by an ENT as a young child.   3. Strategies that help improve hearing include: A) Face the speaker directly. Optimal is having the speakers face well - lit.  Unless amplified, being within 3-6 feet of the speaker will enhance word recognition. B) Avoid having the speaker back-lit as this will minimize the ability to  use cues from lip-reading, facial expression and gestures. C)  Word recognition is poorer in background noise. For optimal word recognition, turn off the TV, radio or noisy fan when engaging in conversation. In a restaurant, try to sit away from noise sources and close to the primary speaker.  D)  Ask for topic clarification from time to time in order to remain in the conversation.  Most people don't mind repeating or clarifying a point when asked.  If needed, explain the difficulty hearing in background noise or hearing loss. 4.   Use hearing protection during noisy activities such as using a weed eater, moving the lawn, shooting, etc.   5.   Follow-up with [employer] regarding an amplified/captioned telephone for home, cellphone and/or ipad use.   Deborah L. Heide Spark, Au.D., CCC-A Doctor of Audiology 10/09/2016  cc: Melina Schools, DO       [facility], Lonzo Candy, AuD

## 2016-10-17 NOTE — Telephone Encounter (Signed)
Pt was advised. ep °

## 2016-12-09 ENCOUNTER — Other Ambulatory Visit (HOSPITAL_COMMUNITY)
Admission: RE | Admit: 2016-12-09 | Discharge: 2016-12-09 | Disposition: A | Discharge status: Home / Self Care | Payer: Medicaid Other | Source: Ambulatory Visit | Attending: Family Medicine | Admitting: Family Medicine

## 2016-12-09 ENCOUNTER — Encounter: Payer: Self-pay | Admitting: Family Medicine

## 2016-12-09 ENCOUNTER — Ambulatory Visit (INDEPENDENT_AMBULATORY_CARE_PROVIDER_SITE_OTHER): Payer: Medicaid Other | Admitting: Family Medicine

## 2016-12-09 VITALS — BP 110/62 | HR 59 | Temp 98.8°F | Wt 171.0 lb

## 2016-12-09 DIAGNOSIS — N898 Other specified noninflammatory disorders of vagina: Secondary | ICD-10-CM | POA: Diagnosis present

## 2016-12-09 DIAGNOSIS — B9689 Other specified bacterial agents as the cause of diseases classified elsewhere: Secondary | ICD-10-CM | POA: Diagnosis not present

## 2016-12-09 LAB — POCT WET PREP (WET MOUNT)
Clue Cells Wet Prep Whiff POC: NEGATIVE
TRICHOMONAS WET PREP HPF POC: ABSENT

## 2016-12-09 MED ORDER — FLUCONAZOLE 150 MG PO TABS: 150.0000 mg | ORAL_TABLET | Freq: Once | ORAL | 0 refills | Status: AC

## 2016-12-09 MED ORDER — METRONIDAZOLE 500 MG PO TABS: 500.0000 mg | ORAL_TABLET | Freq: Two times a day (BID) | ORAL | 0 refills | Status: AC

## 2016-12-09 NOTE — Progress Notes (Signed)
    Subjective:  [name] is a 41 y.o. female who presents to the [facility] today with a chief complaint of vaginal discharge.   HPI:  Vaginal Discharge Symptoms started 2 days ago. Discharge is thick and while with some foul odor. Tried using an OTC gel whic helped some. She uses and IUD for birth control. No fevers or chills. No abdominal pain. No vaginal irritation.  ROS: Per HPI  PMH: Smoking history reviewed.   Objective:  Physical Exam: BP 110/62   Pulse (!) 59   Temp 98.8 F (37.1 C) (Oral)   Wt 171 lb (77.6 kg)   SpO2 97%   BMI 35.74 kg/m   Gen: NAD, resting comfortably GI: Normal bowel sounds present. Soft, Nontender, Nondistended. GU: Normal external female genitalia. Thick white, curd-like discharge noted in vaginal vault. No CMT. Skin: warm, dry Neuro: grossly normal, moves all extremities Psych: Normal affect and thought content  Results for orders placed or performed in visit on 12/09/16 (from the past 72 hour(s))  POCT Wet Prep [facility] [place])     Status: Abnormal   Collection Time: 12/09/16  3:15 PM  Result Value Ref Range   Source Wet Prep POC VAG    WBC, Wet Prep HPF POC 1-5    Bacteria Wet Prep HPF POC Many (A) Few   Clue Cells Wet Prep HPF POC Few (A) None   Clue Cells Wet Prep Whiff POC Negative Whiff    Yeast Wet Prep HPF POC None    Trichomonas Wet Prep HPF POC Absent Absent   Assessment/Plan:  Vaginal Discharge Wet prep consistent with BV, though clinical exam more consistent with yeast vaginitis. Will treat with course of flagyl. Also gave Rx for a single dose of diflucan to use if symptoms not improving with flagyl. Will also send GC/CT. Return precautions reviewed. Follow up as needed.   Algis Greenhouse. Jerline Pain, [place] Resident PGY-3 12/09/2016 4:09 PM

## 2016-12-09 NOTE — Patient Instructions (Signed)
Start the flagyl.  If it is not getting better, take the diflucan.  Let us know if it has not improved in 1 week.  Take care,  Dr Jerline Pain

## 2016-12-10 LAB — CERVICOVAGINAL ANCILLARY ONLY
Bacterial vaginitis: POSITIVE — AB
CANDIDA VAGINITIS: NEGATIVE
Chlamydia: NEGATIVE
Neisseria Gonorrhea: NEGATIVE
TRICH (WINDOWPATH): NEGATIVE

## 2016-12-11 ENCOUNTER — Encounter: Payer: Self-pay | Admitting: Family Medicine

## 2017-01-13 NOTE — Progress Notes (Signed)
    Subjective:  [name] is a 41 y.o. female who presents to the [facility] today with a chief complaint of vaginal issues.  HPI:  Vaginal itching since Thursday, similar to past vaginal infections. No vaginal discharge. Uses powder in underwear to keep dry. Took leftover Diflucan on Friday which helped some.  Metronidazole cream used for 2 days over the weekend. But continuing to itching on the outside. No sexually active since last STD testing in April.    ROS: Per HPI  Objective:  Physical Exam: BP 106/60   Pulse 73   Temp 98.1 F (36.7 C) (Oral)   Wt 170 lb (77.1 kg)   SpO2 98%   BMI 35.53 kg/m   Gen: NAD, resting comfortably GU: normal female. Thick white discharge. Cervix without lesions.  Skin: warm, dry Neuro: grossly normal, moves all extremities Psych: Normal affect and thought content  Results for orders placed or performed in visit on 01/14/17 (from the past 72 hour(s))  POCT Wet Prep Lenard Forth [place])     Status: Abnormal   Collection Time: 01/14/17  9:50 AM  Result Value Ref Range   Source Wet Prep POC VAG    WBC, Wet Prep HPF POC 1-5    Bacteria Wet Prep HPF POC Moderate (A) Few   Clue Cells Wet Prep HPF POC None None   Clue Cells Wet Prep Whiff POC Negative Whiff    Yeast Wet Prep HPF POC Moderate    Trichomonas Wet Prep HPF POC Absent Absent     Assessment/Plan:  Yeast infection of the vagina Wet prep c/w vaginal yeast infection. Since patient had tried diflucan x1 this weekend, will try 2 doses of diflucan 72h apart. Counseled patient on supportive care with dry cotton underwear, no products in vaginal area, and probiotics.   Bufford Lope, DO PGY-1, [facility] 01/14/2017 9:46 AM

## 2017-01-14 ENCOUNTER — Encounter: Payer: Self-pay | Admitting: Family Medicine

## 2017-01-14 ENCOUNTER — Ambulatory Visit (INDEPENDENT_AMBULATORY_CARE_PROVIDER_SITE_OTHER): Payer: Medicaid Other | Admitting: Family Medicine

## 2017-01-14 VITALS — BP 106/60 | HR 73 | Temp 98.1°F | Wt 170.0 lb

## 2017-01-14 DIAGNOSIS — B373 Candidiasis of vulva and vagina: Secondary | ICD-10-CM

## 2017-01-14 DIAGNOSIS — B3731 Acute candidiasis of vulva and vagina: Secondary | ICD-10-CM

## 2017-01-14 LAB — POCT WET PREP (WET MOUNT)
Clue Cells Wet Prep Whiff POC: NEGATIVE
Trichomonas Wet Prep HPF POC: ABSENT

## 2017-01-14 MED ORDER — FLUCONAZOLE 150 MG PO TABS: ORAL_TABLET | ORAL | 0 refills | Status: DC

## 2017-01-14 NOTE — Assessment & Plan Note (Signed)
Wet prep c/w vaginal yeast infection. Since patient had tried diflucan x1 this weekend, will try 2 doses of diflucan 72h apart. Counseled patient on supportive care with dry cotton underwear, no products in vaginal area, and probiotics.

## 2017-01-14 NOTE — Patient Instructions (Signed)
For your yeast infection, - Take 1 tablet of Diflucan then take the 2nd tablet 72 hours later.   Follow these instructions at home:  Take or apply over-the-counter and prescription medicines only as told by your health care provider.  Do not have sex until your health care provider has approved. Tell your sex partner that you have a yeast infection. That person should go to his or her health care provider if he or she develops symptoms.  Do not wear tight clothes, such as pantyhose or tight pants.  Avoid using tampons until your health care provider approves.  Eat more yogurt. This may help to keep your yeast infection from returning.  Do not douche.  Wear breathable, cotton underwear.   Take care and seek immediate care sooner if you develop any concerns.   Dr. Bufford Lope, [place]

## 2017-03-04 ENCOUNTER — Encounter: Payer: Self-pay | Admitting: Family Medicine

## 2017-03-04 ENCOUNTER — Other Ambulatory Visit (HOSPITAL_COMMUNITY)
Admission: RE | Admit: 2017-03-04 | Discharge: 2017-03-04 | Disposition: A | Discharge status: Home / Self Care | Payer: Medicaid Other | Source: Ambulatory Visit | Attending: Family Medicine | Admitting: Family Medicine

## 2017-03-04 ENCOUNTER — Ambulatory Visit (INDEPENDENT_AMBULATORY_CARE_PROVIDER_SITE_OTHER): Payer: Medicaid Other | Admitting: Family Medicine

## 2017-03-04 VITALS — BP 138/82 | HR 73 | Temp 98.8°F | Wt 171.2 lb

## 2017-03-04 DIAGNOSIS — J029 Acute pharyngitis, unspecified: Secondary | ICD-10-CM | POA: Diagnosis not present

## 2017-03-04 DIAGNOSIS — L298 Other pruritus: Secondary | ICD-10-CM

## 2017-03-04 DIAGNOSIS — N898 Other specified noninflammatory disorders of vagina: Secondary | ICD-10-CM

## 2017-03-04 DIAGNOSIS — Z202 Contact with and (suspected) exposure to infections with a predominantly sexual mode of transmission: Secondary | ICD-10-CM | POA: Diagnosis not present

## 2017-03-04 LAB — POCT WET PREP (WET MOUNT)
CLUE CELLS WET PREP WHIFF POC: NEGATIVE
TRICHOMONAS WET PREP HPF POC: ABSENT

## 2017-03-04 LAB — POCT RAPID STREP A (OFFICE): Rapid Strep A Screen: NEGATIVE

## 2017-03-04 MED ORDER — AZITHROMYCIN 1 G PO PACK
1.0000 g | PACK | Freq: Once | ORAL | Status: AC
  Administered 2017-03-04: 1 g via ORAL

## 2017-03-04 MED ORDER — CEFTRIAXONE SODIUM 250 MG IJ SOLR: 250.0000 mg | Freq: Once | INTRAMUSCULAR | Status: DC

## 2017-03-04 MED ORDER — CEFTRIAXONE SODIUM 250 MG IJ SOLR
250.0000 mg | Freq: Once | INTRAMUSCULAR | 0 refills | Status: DC
Start: 2017-03-04 — End: 2017-03-04

## 2017-03-04 MED ORDER — AZITHROMYCIN 500 MG PO TABS: 1000.0000 mg | ORAL_TABLET | Freq: Once | ORAL | Status: DC

## 2017-03-04 MED ORDER — CEFTRIAXONE SODIUM 250 MG IJ SOLR
250.0000 mg | Freq: Once | INTRAMUSCULAR | Status: AC
  Administered 2017-03-04: 250 mg via INTRAMUSCULAR

## 2017-03-04 NOTE — Progress Notes (Signed)
    Subjective:    Patient ID: [name], female    DOB: [birth year], 41 y.o.   MRN: [id number]   CC: sore throat, vaginal itching  Had intercourse Saturday night with her usual partner, engaged in oral sex. The following morning she had a mild sore throat but ignored it. This has worsened and is accompanied by vaginal itching. She frequently gets BV or yeast infections. She did not use protection. No fevers or chills. Reports some difficulty swallowing, feels as if something is caught in her throat. She feels her voice sounds muffled.   Smoking status reviewed- non-smoker  Review of Systems- see HPI   Objective:  BP 138/82   Pulse 73   Temp 98.8 F (37.1 C) (Oral)   Wt 171 lb 3.2 oz (77.7 kg)   SpO2 98%   BMI 35.78 kg/m  Vitals and nursing note reviewed  General: well nourished, in no acute distress. Muffled speech HEENT: normocephalic, moist mucous membranes, good dentition. Petechia noted on soft palate. No erythema or exudate in posterior oropharynx.  Neck: supple, non-tender, without lymphadenopathy Abdomen: soft, nontender, nondistended, no masses or organomegaly. Bowel sounds present GU: moderate amount of thick white discharge, cervix pink and smooth, no lesions noted, normal external female genitalia Extremities: no edema or cyanosis Skin: warm and dry, no rashes noted Neuro: alert and oriented, no focal deficits   Assessment & Plan:    No problem-specific Assessment & Plan notes found for this encounter.  Sore throat- concern for strep throat vs gonorrhea/chlamydial infection, also due to muffled voice concern for peritonsillar abscess formation -treat empirically for GC/chlamydia -rapid strep done -GC/chlamydia throat culture sent to lab -ED precautions given for worsening sore throat, difficulty swallowing/breathing, or worsening muffled speech as patient could have peritonsillar abscess  Vaginal itching-  -wet prep done -gc/chlamydia  sent -empirically treated with 1 gram azithro and 250mg  CTX in office -follow up if symptoms worsen  Return if symptoms worsen or fail to improve.   Lucila Maine, DO Family Medicine Resident PGY-2

## 2017-03-04 NOTE — Patient Instructions (Signed)
Peritonsillar Abscess A peritonsillar abscess is a collection of yellowish-white fluid (pus) in the back of the throat. It forms behind the tonsils. Treatment usually involves draining the fluid. This may be done by:  Putting a needle into the abscess.  Cutting and draining the abscess.  Follow these instructions at home:  Rest as much as you can.  Take medicines only as told by your doctor.  If you were given an antibiotic medicine, finish it all even if you start to feel better.  If your abscess was drained by your doctor: ? Mix 1 teaspoon of salt in 8 ounces of warm water for gargling. ? Gargle 4 times per day or as needed for comfort. ? Do not swallow this mixture.  Drink a lot of fluids.  Eat soft or liquid foods while your throat is sore. Frozen ice pops and ice cream are good choices.  Keep all follow-up visits as told by your doctor. This is important. Contact a doctor if:  You have more pain, swelling, redness, or drainage in your throat.  You have a headache, have low energy, or feel sick.  You have a fever.  You feel dizzy.  You have trouble swallowing or eating.  You have signs of body fluid loss (dehydration): ? Light-headedness when you are standing. ? Peeing (urinating) less. ? A fast heart rate. ? Dry mouth. Get help right away if:  You have trouble talking or breathing.  You find it easier to breathe when you lean forward.  You are coughing up blood or throwing up (vomiting) blood.  You have severe throat pain that is not helped by medicines.  You start to drool. This information is not intended to replace advice given to you by your health care provider. Make sure you discuss any questions you have with your health care provider. Document Released: 07/31/2009 Document Revised: 01/18/2016 Document Reviewed: 03/28/2014 Elsevier Interactive Patient Education  2018 [employer].

## 2017-03-06 ENCOUNTER — Telehealth: Payer: Self-pay | Admitting: Family Medicine

## 2017-03-06 LAB — CERVICOVAGINAL ANCILLARY ONLY
CHLAMYDIA, DNA PROBE: NEGATIVE
Chlamydia: NEGATIVE
NEISSERIA GONORRHEA: NEGATIVE
NEISSERIA GONORRHEA: NEGATIVE

## 2017-03-06 NOTE — Telephone Encounter (Signed)
  Spoke with Ms. Pandolfi about her negative Gc/chlamydia test results. Her throat is feeling better. She is doing well. Advised to call us with any questions or needs.  Lucila Maine, DO PGY-2, [facility] 03/06/2017 5:02 PM

## 2017-07-30 ENCOUNTER — Ambulatory Visit: Payer: Medicaid Other | Admitting: Family Medicine

## 2017-07-30 ENCOUNTER — Encounter: Payer: Self-pay | Admitting: Family Medicine

## 2017-07-30 ENCOUNTER — Telehealth: Payer: Self-pay

## 2017-07-30 ENCOUNTER — Other Ambulatory Visit: Payer: Self-pay

## 2017-07-30 VITALS — BP 128/70 | HR 96 | Temp 98.1°F | Wt 172.0 lb

## 2017-07-30 DIAGNOSIS — T753XXA Motion sickness, initial encounter: Secondary | ICD-10-CM

## 2017-07-30 DIAGNOSIS — N898 Other specified noninflammatory disorders of vagina: Secondary | ICD-10-CM

## 2017-07-30 LAB — POCT WET PREP (WET MOUNT)
CLUE CELLS WET PREP WHIFF POC: POSITIVE
TRICHOMONAS WET PREP HPF POC: ABSENT

## 2017-07-30 MED ORDER — SCOPOLAMINE 1 MG/3DAYS TD PT72: 1.0000 | MEDICATED_PATCH | TRANSDERMAL | 0 refills | Status: DC

## 2017-07-30 MED ORDER — METRONIDAZOLE 500 MG PO TABS: 500.0000 mg | ORAL_TABLET | Freq: Two times a day (BID) | ORAL | 0 refills | Status: AC

## 2017-07-30 NOTE — Telephone Encounter (Signed)
Pt positive for BV, per wet prep. Medication has been sent to her pharmacy, pt has been called and informed.

## 2017-07-30 NOTE — Progress Notes (Signed)
   Subjective:    Patient ID: [name], female    DOB: May 15, 1976, 41 y.o.   MRN: [id number]   CC: Vaginal discharge  HPI: Patient is a 41 yo female who presents today complaining of vaginal discharge. Patient reports some irritation and malodorous discharge for the past 4 days. Patient denies any bleeding or itching. Patient has a history of recurrent BV but reports that she has been doing well for the past few months. Patient denies any risky sexual behavior, in a monogamous relationship,and does not want STD check. Patient would also like anti motion sickness medications with an upcoming 7 day cruise to the [place].  Smoking status reviewed   ROS: all other systems were reviewed and are negative other than in the HPI   History reviewed. No pertinent past medical history.  Past Surgical History:  Procedure Laterality Date  . CESAREAN SECTION      Past medical history, surgical, family, and social history reviewed and updated in the EMR as appropriate.  Objective:  BP 128/70   Pulse 96   Temp 98.1 F (36.7 C) (Oral)   Wt 172 lb (78 kg)   SpO2 98%   BMI 35.95 kg/m   Vitals and nursing note reviewed  General: NAD, pleasant, able to participate in exam Cardiac: RRR, normal heart sounds, no murmurs. 2+ radial and PT pulses bilaterally Respiratory: CTAB, normal effort, No wheezes, rales or rhonchi Abdomen: soft, nontender, nondistended, no hepatic or splenomegaly, +BS Female genitalia: not done normal external genitalia, vulva, vagina, cervix, uterus and adnexa Cervix: normal appearing cervix without discharge or lesions and cervical discharge present - white, copious and malodorous Extremities: no edema or cyanosis. WWP. Skin: warm and dry, no rashes noted Neuro: alert and oriented x4, no focal deficits Psych: Normal affect and mood   Assessment & Plan:   #Vaginal discharge, acute Patient presents with symptoms of vaginal irritation with malodorous discharge  concerning for BV. Exam findings were consistent, with milky discharge from cervix and no other cervical or vaginal findings. Wet prep  Showed many clue cells and was negative for trich and yeast. --Prescribe metronidazole 500 mg bid for 7 days --Follow up in clinic if symptoms do no improve or worsen with current therapy  #Motion sickness Patient will be going on a cruise on 12/6 and would like anti motion sickness medications prior to her trip. --Prescribe Scopolamine transdermal patch 1 mg/3 days.    Marjie Skiff, MD [place] PGY-2

## 2017-07-30 NOTE — Patient Instructions (Signed)
It was great seeing you today! We have addressed the following issues today  1. I sent the scopolamine patch to your pharmacy for your cruise. I patch last 3 days.    If we did any lab work today, and the results require attention, either me or my nurse will get in touch with you. If everything is normal, you will get a letter in mail and a message via . If you don't hear from [place] in two weeks, please give [place] a call. Otherwise, we look forward to seeing you again at your next visit. If you have any questions or concerns before then, please call the clinic at [phone].  Please bring all your medications to every doctors visit  Sign up for My Chart to have easy access to your labs results, and communication with your Primary care physician. Please ask Front Desk for some assistance.   Please check-out at the front desk before leaving the clinic.    Take Care,   Dr. Andy Gauss

## 2017-09-01 ENCOUNTER — Ambulatory Visit
Admission: RE | Admit: 2017-09-01 | Discharge: 2017-09-01 | Disposition: A | Discharge status: Home / Self Care | Payer: Medicaid Other | Source: Ambulatory Visit | Attending: Family Medicine | Admitting: Family Medicine

## 2017-09-01 ENCOUNTER — Other Ambulatory Visit: Payer: Self-pay

## 2017-09-01 ENCOUNTER — Ambulatory Visit: Payer: Medicaid Other | Admitting: Internal Medicine

## 2017-09-01 VITALS — BP 130/76 | HR 65 | Temp 98.5°F | Ht <= 58 in | Wt 172.4 lb

## 2017-09-01 DIAGNOSIS — M533 Sacrococcygeal disorders, not elsewhere classified: Secondary | ICD-10-CM | POA: Diagnosis present

## 2017-09-01 MED ORDER — MELOXICAM 7.5 MG PO TABS: 7.5000 mg | ORAL_TABLET | Freq: Every day | ORAL | 0 refills | Status: DC

## 2017-09-01 NOTE — Assessment & Plan Note (Signed)
Intermittent x42yr, worse for past 3 days. Possibly related to fall on ice one year ago, however would expect pain to have started immediately following accident, which it did not. No other trauma to explain pain. TTP on physical exam suggesting MSK or bone etiology. 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 Etiology unclear at this time. Will treat current flare with Mobic, as ibuprofen has worked in the past for patient. Also recommended donut pillow. Discussed XR results with patient, and encouraged her to call if pain is worsening. If worsening pain or no improvement, could consider further imaging or physical therapy (would likely need pelvic floor PT specialist). Would also recommend a rectal exam to fully evaluate region, and inquiring as to whether patient is having dyspareunia.  Precepted with Dr. Gwendlyn Deutscher and Dr. Walker Kehr.

## 2017-09-01 NOTE — Patient Instructions (Signed)
It was nice meeting you today Ms. Plotz!  For pain, you can take one Mobic (meloxicam) tablet daily. DO NOT take more than one per day. DO NOT take Mobic with ibuprofen, Aleve, Motrin, naproxen, or products containing any of these medications. You can take Tylenol with Mobic.   It may also be helpful to sit on a donut cushion. You can buy these at any medical supply store or pharmacy.   Please go to [facility] to have your xray performed at your earliest convenience. I will call you to discuss the results when they are available.   If you have any questions or concerns, please feel free to call the clinic.   Be well,  Dr. Avon Gully

## 2017-09-01 NOTE — Progress Notes (Signed)
   Subjective:   Patient: [name]       Birthdate: November 25, 1975       MRN: [id number]      HPI  [name] is a 42 y.o. female presenting for walk in appt for pain with sitting down.   Pain with sitting down On and off for the past year. Fell on ice last year but did not have pain immediately after this. Starting having pain with sitting down a few weeks later. Presented today because pain began about three days ago and has not subsided. Pain is present regardless of whether she is sitting on a hard or soft surface. Even has pain when laying down in bed. Has tried ibuprofen which does help some. Also tried heating pad and a pain muscle rub which did not help. Denies any skin changes over the affected area, including no erythema or bruising. Describes pain as a dull ache. No sharp shooting pains, no pain in legs.   Smoking status reviewed. Patient is never smoker.   Review of Systems See HPI.     Objective:  Physical Exam  Constitutional: She is oriented to person, place, and time and well-developed, well-nourished, and in no distress.  HENT:  Head: Normocephalic and atraumatic.  Pulmonary/Chest: Effort normal. No respiratory distress.  Musculoskeletal:  TTP of upper gluteal cleft R>L. Perched on edge of seat throughout encounter due to pain when sitting normally  Neurological: She is alert and oriented to person, place, and time.  Psychiatric: Affect and judgment normal.      Assessment & Plan:  Coccygeal pain Intermittent x64yr, worse for past 3 days. Possibly related to fall on ice one year ago, however would expect pain to have started immediately following accident, which it did not. No other trauma to explain pain. TTP on physical exam suggesting MSK or bone etiology. 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 Etiology unclear at this time. Will treat current flare with Mobic, as  ibuprofen has worked in the past for patient. Also recommended donut pillow. Discussed XR results with patient, and encouraged her to call if pain is worsening. If worsening pain or no improvement, could consider further imaging or physical therapy (would likely need pelvic floor PT specialist). Would also recommend a rectal exam to fully evaluate region, and inquiring as to whether patient is having dyspareunia.  Precepted with Dr. Gwendlyn Deutscher and Dr. Walker Kehr.   Adin Hector, MD, MPH PGY-3 [facility] Pager [phone]

## 2017-09-30 ENCOUNTER — Other Ambulatory Visit: Payer: Self-pay

## 2017-09-30 ENCOUNTER — Ambulatory Visit (HOSPITAL_COMMUNITY)
Admission: EM | Admit: 2017-09-30 | Discharge: 2017-09-30 | Disposition: A | Discharge status: Home / Self Care | Payer: Medicaid Other | Attending: Emergency Medicine | Admitting: Emergency Medicine

## 2017-09-30 ENCOUNTER — Encounter (HOSPITAL_COMMUNITY): Payer: Self-pay | Admitting: Emergency Medicine

## 2017-09-30 DIAGNOSIS — J0140 Acute pansinusitis, unspecified: Secondary | ICD-10-CM | POA: Diagnosis not present

## 2017-09-30 MED ORDER — FLUCONAZOLE 150 MG PO TABS: 150.0000 mg | ORAL_TABLET | Freq: Every day | ORAL | 0 refills | Status: DC

## 2017-09-30 MED ORDER — AMOXICILLIN-POT CLAVULANATE 875-125 MG PO TABS: 1.0000 | ORAL_TABLET | Freq: Two times a day (BID) | ORAL | 0 refills | Status: DC

## 2017-09-30 MED ORDER — PREDNISONE 20 MG PO TABS: 40.0000 mg | ORAL_TABLET | Freq: Every day | ORAL | 0 refills | Status: AC

## 2017-09-30 MED ORDER — FLUTICASONE PROPIONATE 50 MCG/ACT NA SUSP: 2.0000 | Freq: Every day | NASAL | 0 refills | Status: DC

## 2017-09-30 NOTE — ED Provider Notes (Signed)
[facility]    CSN: [id number] Arrival date & time: 09/30/17  1013     History   Chief Complaint Chief Complaint  Patient presents with  . Otalgia    HPI [name] is a 42 y.o. female.   42 year old female comes in for 1-2-week history of sinus pressure, bilateral ear pressure.  Has also had sore throat, nasal congestion, rhinorrhea last week that has since resolved.  She is noticed right lower gum has been sore and painful recently, states she lost  both her tooth at that location in the past, usually does chew at that location, but has to avoid recently due to the pain.  Has not noticed any facial swelling.  Denies fever, chills, night sweats.  Denies trouble swallowing, swelling of the throat, drooling, tripoding.  Has not taken anything for the symptoms.      History reviewed. No pertinent past medical history.  Patient Active Problem List   Diagnosis Date Noted  . Coccygeal pain 09/01/2017  . Contraception 09/25/2016  . Hearing loss 07/22/2016  . Knee pain, left 07/22/2016  . Shoulder pain, right 07/22/2016  . Bacterial vaginosis 07/09/2012  . Yeast infection of the vagina 03/23/2012  . Obesity (BMI 30.0-34.9) 10/31/2011  . ASCUS PAP 03/16/2009  . CHLAMYDIAL INFECTION, HX OF 02/03/2007    Past Surgical History:  Procedure Laterality Date  . CESAREAN SECTION      OB History    No data available       Home Medications    Prior to Admission medications   Medication Sig Start Date End Date Taking? Authorizing Provider  amoxicillin-clavulanate (AUGMENTIN) 875-125 MG tablet Take 1 tablet by mouth every 12 (twelve) hours. 09/30/17   Tasia Catchings, Lavalle Skoda V, PA-C  fluconazole (DIFLUCAN) 150 MG tablet Take 1 tablet (150 mg total) by mouth daily. Take second dose 72 hours later if symptoms still persists. 09/30/17   Tasia Catchings, Keiko Myricks V, PA-C  fluticasone (FLONASE) 50 MCG/ACT nasal spray Place 2 sprays into both nostrils daily. 09/30/17   Tasia Catchings, Vasiliy Mccarry V, PA-C  meloxicam (MOBIC) 7.5  MG tablet Take 1 tablet (7.5 mg total) by mouth daily. 09/01/17   Verner Mould, MD  predniSONE (DELTASONE) 20 MG tablet Take 2 tablets (40 mg total) by mouth daily for 4 days. 09/30/17 10/04/17  Ok Edwards, PA-C  scopolamine (TRANSDERM-SCOP) 1 MG/3DAYS Place 1 patch (1.5 mg total) onto the skin every 3 (three) days. 07/30/17   Marjie Skiff, MD    Family History Family History  Problem Relation Age of Onset  . Hypertension Mother   . Diabetes Mother   . CAD Mother     Social History Social History   Tobacco Use  . Smoking status: Never Smoker  . Smokeless tobacco: Never Used  Substance Use Topics  . Alcohol use: Yes    Comment: occ  . Drug use: No     Allergies   Patient has no known allergies.   Review of Systems Review of Systems  Reason unable to perform ROS: See HPI as above.     Physical Exam Triage Vital Signs ED Triage Vitals  Enc Vitals Group     BP 09/30/17 1125 131/85     Pulse Rate 09/30/17 1125 70     Resp 09/30/17 1125 18     Temp 09/30/17 1125 98.2 F (36.8 C)     Temp Source 09/30/17 1125 Oral     SpO2 09/30/17 1125 97 %  Weight --      Height --      Head Circumference --      Peak Flow --      Pain Score 09/30/17 1123 6     Pain Loc --      Pain Edu? --      Excl. in [place]? --    No data found.  Updated Vital Signs BP 131/85 (BP Location: Right Arm) Comment (BP Location): large cuff  Pulse 70   Temp 98.2 F (36.8 C) (Oral)   Resp 18   SpO2 97%   Physical Exam  Constitutional: She is oriented to person, place, and time. She appears well-developed and well-nourished. No distress.  HENT:  Head: Normocephalic and atraumatic.  Right Ear: External ear and ear canal normal. Tympanic membrane is not erythematous and not bulging. A middle ear effusion is present.  Left Ear: External ear and ear canal normal. Tympanic membrane is not erythematous and not bulging. A middle ear effusion is present.  Nose: Nose normal. Right sinus  exhibits no maxillary sinus tenderness and no frontal sinus tenderness. Left sinus exhibits no maxillary sinus tenderness and no frontal sinus tenderness.  Mouth/Throat: Uvula is midline, oropharynx is clear and moist and mucous membranes are normal.  Tenderness on palpation of right lower gum without obvious swelling/fluctuane. Missing tooth #31 and 32.   Floor of mouth soft to palpation. No facial swelling.   Eyes: Conjunctivae are normal. Pupils are equal, round, and reactive to light.  Neck: Normal range of motion. Neck supple.  Cardiovascular: Normal rate, regular rhythm and normal heart sounds. Exam reveals no gallop and no friction rub.  No murmur heard. Pulmonary/Chest: Effort normal and breath sounds normal. She has no decreased breath sounds. She has no wheezes. She has no rhonchi. She has no rales.  Lymphadenopathy:    She has no cervical adenopathy.  Neurological: She is alert and oriented to person, place, and time.  Skin: Skin is warm and dry.  Psychiatric: She has a normal mood and affect. Her behavior is normal. Judgment normal.   UC Treatments / Results  Labs (all labs ordered are listed, but only abnormal results are displayed) Labs Reviewed - No data to display  EKG  EKG Interpretation None       Radiology No results found.  Procedures Procedures (including critical care time)  Medications Ordered in UC Medications - No data to display   Initial Impression / Assessment and Plan / UC Course  I have reviewed the triage vital signs and the nursing notes.  Pertinent labs & imaging results that were available during my care of the patient were reviewed by me and considered in my medical decision making (see chart for details).    Augmentin for sinusitis/possible dental infection. Start prednisone and flonase for sinus pressure/mid ear effusion. Follow up with PCP for reevaluation as needed. Return precautions given.   Final Clinical Impressions(s) / UC  Diagnoses   Final diagnoses:  Acute non-recurrent pansinusitis    ED Discharge Orders        Ordered    amoxicillin-clavulanate (AUGMENTIN) 875-125 MG tablet  Every 12 hours     09/30/17 1144    fluconazole (DIFLUCAN) 150 MG tablet  Daily     09/30/17 1144    fluticasone (FLONASE) 50 MCG/ACT nasal spray  Daily     09/30/17 1144    predniSONE (DELTASONE) 20 MG tablet  Daily     09/30/17 1144  Ok Edwards, PA-C 09/30/17 1156

## 2017-09-30 NOTE — Discharge Instructions (Signed)
Augmentin for sinus infection, which will also cover for any infection in the gums. Diflucan called in. Flonase nasal spray and prednisone as directed for sinus pressure. Keep hydrated, your urine should be clear to pale yellow in color. Follow up with PCP if symptoms not improving for reevaluation.

## 2017-09-30 NOTE — ED Triage Notes (Signed)
Reports sinus issues, mild recently.  Both ears are muffled, "stopped up".  Last week throat hurt, but that is gone.  Patient says right lower gum is sore/painful.

## 2017-10-07 ENCOUNTER — Other Ambulatory Visit: Payer: Self-pay

## 2017-10-07 ENCOUNTER — Ambulatory Visit: Payer: Medicaid Other | Admitting: Internal Medicine

## 2017-10-07 ENCOUNTER — Encounter: Payer: Self-pay | Admitting: Internal Medicine

## 2017-10-07 VITALS — BP 122/78 | HR 78 | Temp 98.4°F | Ht <= 58 in | Wt 174.0 lb

## 2017-10-07 DIAGNOSIS — L219 Seborrheic dermatitis, unspecified: Secondary | ICD-10-CM | POA: Diagnosis not present

## 2017-10-07 DIAGNOSIS — N898 Other specified noninflammatory disorders of vagina: Secondary | ICD-10-CM | POA: Diagnosis not present

## 2017-10-07 LAB — POCT WET PREP (WET MOUNT)
CLUE CELLS WET PREP WHIFF POC: NEGATIVE
Trichomonas Wet Prep HPF POC: ABSENT

## 2017-10-07 MED ORDER — FLUCONAZOLE 150 MG PO TABS: ORAL_TABLET | ORAL | 0 refills | Status: DC

## 2017-10-07 NOTE — Progress Notes (Signed)
   Subjective:    Mandy [name] - 42 y.o. [name] MRN [id number]  Date of birth: July 08, 1976  HPI  Mandy [name] is here for various concerns.  Vaginal Discharge: Has been present for about 3-4 days. It is irritating and somewhat itchy. Has not noticed a foul odor. Has a history of recurrent BV. Uses IUD for contraception. Denies risky sexual behavior and declines STD testing. No pelvic pain, dysuria/frequency/urgency, abnormal vaginal bleeding.  Flaking Scalp: Reports several month history of scaling, flaking scalp. Also notices it along the hairline of her forehead. Only mildly itchy. The appearance is what bothers her. She has tried OTC dandruff shampoos with no relief. She now just has her hair washed at the beauty parlor because otherwise symptoms worsen if she tries to deal with it at home. No hair loss noted.    -  reports that  has never smoked. she has never used smokeless tobacco. - Review of Systems: Per HPI. - Past Medical History: Patient Active Problem List   Diagnosis Date Noted  . Coccygeal pain 09/01/2017  . Contraception 09/25/2016  . Hearing loss 07/22/2016  . Knee pain, left 07/22/2016  . Shoulder pain, right 07/22/2016  . Bacterial vaginosis 07/09/2012  . Yeast infection of the vagina 03/23/2012  . Obesity (BMI 30.0-34.9) 10/31/2011  . ASCUS PAP 03/16/2009  . CHLAMYDIAL INFECTION, HX OF 02/03/2007   - Medications: reviewed and updated   Objective:   Physical Exam BP 122/78   Pulse 78   Temp 98.4 F (36.9 C) (Oral)   Ht 4\' 8"  (1.422 m)   Wt 174 lb (78.9 kg)   SpO2 98%   BMI 39.01 kg/m  Gen: NAD, alert, cooperative with exam, well-appearing Skin: some mild scaling patches at the hairline on the forehead, no current involvement of the scalp  GU/GYN: Exam performed in the presence of a chaperone. External genitalia within normal limits.  Vaginal mucosa pink, moist, normal rugae.  Nonfriable cervix without lesions or bleeding noted on speculum exam. Mild  amount of thick white discharge present in the vaginal vault. Bimanual exam revealed normal, nongravid uterus.  No cervical motion tenderness.   Assessment & Plan:   1. Vaginal discharge Wet prep only significant for bacteria. However, given history, exam findings, and recent abx use for sinusitis most suspicious for a yeast infection. Will treat as such. Patient to return if symptoms do not improve.  - POCT Wet Prep ([place]) - fluconazole (DIFLUCAN) 150 MG tablet; Take one tablet by mouth now. Take second tablet in 72 hours if symptoms persist.  Dispense: 2 tablet; Refill: 0  2. Seborrheic dermatitis of scalp Although exam is mostly unremarkable, history sounds most consistent with seborrheic dermatitis. Discussed option of Rx for anti-fungal shampoo. Patient wishes to defer treatment and be seen by dermatology. Referral to dermatology placed.  - Ambulatory referral to Dermatology  Mandy [name], D.O. 10/07/2017, 9:45 AM PGY-3, [place]

## 2017-10-07 NOTE — Patient Instructions (Addendum)
Use your Flonase daily for the congestion.   I have placed the referral to dermatology. You will be receiving a phone call soon about an appointment.   Your IUD is due to be removed/replaced in April 2019.   Your wet prep was negative but it appeared consistent with a mild yeast infection. I have sent Diflucan to your pharmacy.

## 2017-12-01 ENCOUNTER — Other Ambulatory Visit (HOSPITAL_COMMUNITY)
Admission: RE | Admit: 2017-12-01 | Discharge: 2017-12-01 | Disposition: A | Discharge status: Home / Self Care | Payer: Medicaid Other | Source: Ambulatory Visit | Attending: Family Medicine | Admitting: Family Medicine

## 2017-12-01 ENCOUNTER — Ambulatory Visit: Payer: Medicaid Other | Admitting: Internal Medicine

## 2017-12-01 ENCOUNTER — Other Ambulatory Visit: Payer: Self-pay

## 2017-12-01 ENCOUNTER — Encounter: Payer: Self-pay | Admitting: Internal Medicine

## 2017-12-01 VITALS — BP 136/78 | HR 66 | Temp 98.4°F | Ht <= 58 in | Wt 167.4 lb

## 2017-12-01 DIAGNOSIS — N898 Other specified noninflammatory disorders of vagina: Secondary | ICD-10-CM | POA: Insufficient documentation

## 2017-12-01 LAB — POCT WET PREP (WET MOUNT)
Clue Cells Wet Prep Whiff POC: NEGATIVE
Trichomonas Wet Prep HPF POC: ABSENT

## 2017-12-01 NOTE — Progress Notes (Signed)
   Subjective:   Patient: [name]       Birthdate: 1976/08/16       MRN: [id number]      HPI  [name] is a 42 y.o. female presenting for same day appt for vaginal discharge.   Vaginal discharge Began about 2w ago. Describes as thick white. Occasional itching. Similar to discharge she had in February when diagnosed with yeast infection after taking antibiotics, but denies recent antibiotic use. Has an IUD and does not have regular periods, so cannot say when her LMP was. Not concerned about STD exposure, but says fine to check GC/chlamydia during pelvic exam today. Denies abd pain, N/V/fevers. Has not taken any meds to help with sx.   Smoking status reviewed. Patient is never smoker.   Review of Systems See HPI.     Objective:  Physical Exam  Constitutional: She is oriented to person, place, and time. She appears well-developed and well-nourished. No distress.  HENT:  Head: Normocephalic and atraumatic.  Cardiovascular: Normal rate.  Pulmonary/Chest: Effort normal. No respiratory distress.  Abdominal: Soft. She exhibits no distension and no mass. There is no tenderness.  Genitourinary: Vagina normal and uterus normal.  Genitourinary Comments: Minimal white mucinous vaginal discharge noted on exam.   Musculoskeletal: Normal range of motion.  Neurological: She is alert and oriented to person, place, and time.  Skin: Skin is warm and dry.  Psychiatric: She has a normal mood and affect. Her behavior is normal.    Assessment & Plan:  Vaginal discharge With occasional itching. Minimal discharged noted on exam. Wet prep with no significant findings. Will await results of GC/chlamydia and treat accordingly if indicated. For now, will continue to monitor. Patient amenable to this plan.     Adin Hector, MD, MPH PGY-3 [facility] Pager [phone]

## 2017-12-01 NOTE — Assessment & Plan Note (Signed)
With occasional itching. Minimal discharged noted on exam. Wet prep with no significant findings. Will await results of GC/chlamydia and treat accordingly if indicated. For now, will continue to monitor. Patient amenable to this plan.

## 2017-12-01 NOTE — Patient Instructions (Addendum)
It was nice seeing you again today Ms. Croke!  I will call you later today with your test results. I will call you in a few days when we have the results of your other testing.   If you have any questions or concerns, please feel free to call the clinic.   Be well,  Dr. Avon Gully

## 2017-12-02 LAB — CERVICOVAGINAL ANCILLARY ONLY
Chlamydia: NEGATIVE
NEISSERIA GONORRHEA: NEGATIVE

## 2017-12-03 ENCOUNTER — Telehealth: Payer: Self-pay | Admitting: Internal Medicine

## 2017-12-03 NOTE — Telephone Encounter (Signed)
Called patient regarding STD screening. No answer. Left voicemail informing her that gonorrhea and chlamydia screening was negative. Encouraged to call with any questions.   Adin Hector, MD, MPH PGY-3 [facility] Pager [phone]

## 2017-12-18 ENCOUNTER — Ambulatory Visit (HOSPITAL_COMMUNITY)
Admission: EM | Admit: 2017-12-18 | Discharge: 2017-12-18 | Disposition: A | Discharge status: Home / Self Care | Payer: Medicaid Other | Attending: Internal Medicine | Admitting: Internal Medicine

## 2017-12-18 ENCOUNTER — Encounter (HOSPITAL_COMMUNITY): Payer: Self-pay | Admitting: Family Medicine

## 2017-12-18 DIAGNOSIS — N760 Acute vaginitis: Secondary | ICD-10-CM | POA: Diagnosis present

## 2017-12-18 DIAGNOSIS — Z79899 Other long term (current) drug therapy: Secondary | ICD-10-CM | POA: Insufficient documentation

## 2017-12-18 MED ORDER — FLUCONAZOLE 150 MG PO TABS: ORAL_TABLET | ORAL | 0 refills | Status: DC

## 2017-12-18 NOTE — ED Triage Notes (Signed)
Pt here for vaginal itching, discharge and irritation since yesterday.

## 2017-12-18 NOTE — Discharge Instructions (Signed)
Will notify you of any positive findings and if any changes to treatment are needed from your urine sample in the next few days. Will treat as yeast at this time. If symptoms worsen or do not improve in the next week to return to be seen or to follow up with your PCP.

## 2017-12-18 NOTE — ED Provider Notes (Signed)
[facility]    CSN: [id number] Arrival date & time: 12/18/17  1743     History   Chief Complaint Chief Complaint  Patient presents with  . Vaginitis    HPI [name] is a 42 y.o. female.   [name] presents with complaints of vaginal itching and burning which started yesterday along with some white thick vaginal discharge. She feels she has irritation when she wipes, but otherwise without urinary pain or frequency. States has had vaginitis in the past. Has not taken any medications for symptoms. Has an IUD. Denies abdominal or back pain. Sexually active, does not use condoms, denies concern for STD     History reviewed. No pertinent past medical history.  Patient Active Problem List   Diagnosis Date Noted  . Coccygeal pain 09/01/2017  . Contraception 09/25/2016  . Hearing loss 07/22/2016  . Knee pain, left 07/22/2016  . Shoulder pain, right 07/22/2016  . Vaginal discharge 05/11/2013  . Obesity (BMI 30.0-34.9) 10/31/2011  . ASCUS PAP 03/16/2009  . CHLAMYDIAL INFECTION, HX OF 02/03/2007    Past Surgical History:  Procedure Laterality Date  . CESAREAN SECTION      OB History   None      Home Medications    Prior to Admission medications   Medication Sig Start Date End Date Taking? Authorizing Provider  fluconazole (DIFLUCAN) 150 MG tablet 1 tab today, may repeat in 72 hours if symptoms persist 12/18/17   Augusto Gamble B, NP  meloxicam (MOBIC) 7.5 MG tablet Take 1 tablet (7.5 mg total) by mouth daily. 09/01/17   Verner Mould, MD  scopolamine (TRANSDERM-SCOP) 1 MG/3DAYS Place 1 patch (1.5 mg total) onto the skin every 3 (three) days. 07/30/17   Marjie Skiff, MD    Family History Family History  Problem Relation Age of Onset  . Hypertension Mother   . Diabetes Mother   . CAD Mother     Social History Social History   Tobacco Use  . Smoking status: Never Smoker  . Smokeless tobacco: Never Used  Substance Use Topics  .  Alcohol use: Yes    Comment: occ  . Drug use: No     Allergies   Patient has no known allergies.   Review of Systems Review of Systems   Physical Exam Triage Vital Signs ED Triage Vitals  Enc Vitals Group     BP 12/18/17 1825 136/81     Pulse Rate 12/18/17 1825 83     Resp 12/18/17 1825 18     Temp 12/18/17 1825 98.1 F (36.7 C)     Temp src --      SpO2 12/18/17 1825 100 %     Weight --      Height --      Head Circumference --      Peak Flow --      Pain Score 12/18/17 1823 0     Pain Loc --      Pain Edu? --      Excl. in [place]? --    No data found.  Updated Vital Signs BP 136/81   Pulse 83   Temp 98.1 F (36.7 C)   Resp 18   SpO2 100%   Visual Acuity Right Eye Distance:   Left Eye Distance:   Bilateral Distance:    Right Eye Near:   Left Eye Near:    Bilateral Near:     Physical Exam  Constitutional: She is oriented to person, place,  and time. She appears well-developed and well-nourished. No distress.  Cardiovascular: Normal rate, regular rhythm and normal heart sounds.  Pulmonary/Chest: Effort normal and breath sounds normal.  Abdominal: Soft. There is no tenderness. There is no guarding.  Genitourinary: Cervix exhibits discharge. Right adnexum displays no tenderness. Left adnexum displays no tenderness. Vaginal discharge found.  Genitourinary Comments: Significant chunky white discharge noted to vulva as well as within vagina and at cervix   Neurological: She is alert and oriented to person, place, and time.  Skin: Skin is warm and dry.     UC Treatments / Results  Labs (all labs ordered are listed, but only abnormal results are displayed) Labs Reviewed  URINE CYTOLOGY ANCILLARY ONLY    EKG None Radiology No results found.  Procedures Procedures (including critical care time)  Medications Ordered in UC Medications - No data to display   Initial Impression / Assessment and Plan / UC Course  I have reviewed the triage vital signs  and the nursing notes.  Pertinent labs & imaging results that were available during my care of the patient were reviewed by me and considered in my medical decision making (see chart for details).     History and physical consistent with yeast vaginitis at this time. Urine cytology pending. Diflucan initiated. Will notify of any positive findings and if any changes to treatment are needed.  If symptoms worsen or do not improve in the next week to return to be seen or to follow up with PCP.  Patient verbalized understanding and agreeable to plan.    Final Clinical Impressions(s) / UC Diagnoses   Final diagnoses:  Acute vaginitis    ED Discharge Orders        Ordered    fluconazole (DIFLUCAN) 150 MG tablet     12/18/17 1852       Controlled Substance Prescriptions [place] Controlled Substance Registry consulted? Not Applicable   Zigmund Gottron, NP 12/18/17 [phone]

## 2017-12-19 LAB — URINE CYTOLOGY ANCILLARY ONLY
CHLAMYDIA, DNA PROBE: NEGATIVE
Neisseria Gonorrhea: NEGATIVE
Trichomonas: NEGATIVE

## 2017-12-20 NOTE — Progress Notes (Signed)
All results are normal, attempted to reach patient, no answer.

## 2017-12-22 LAB — URINE CYTOLOGY ANCILLARY ONLY
Bacterial vaginitis: NEGATIVE
CANDIDA VAGINITIS: NEGATIVE

## 2017-12-23 NOTE — Progress Notes (Signed)
Yeast and bacteria are negative, attempted to reach patient. No answer at this time.

## 2017-12-24 ENCOUNTER — Ambulatory Visit (INDEPENDENT_AMBULATORY_CARE_PROVIDER_SITE_OTHER): Payer: Medicaid Other | Admitting: Internal Medicine

## 2017-12-24 ENCOUNTER — Other Ambulatory Visit: Payer: Self-pay

## 2017-12-24 ENCOUNTER — Encounter: Payer: Self-pay | Admitting: Internal Medicine

## 2017-12-24 VITALS — BP 96/62 | HR 88 | Temp 98.1°F | Wt 168.0 lb

## 2017-12-24 DIAGNOSIS — N898 Other specified noninflammatory disorders of vagina: Secondary | ICD-10-CM | POA: Diagnosis not present

## 2017-12-24 DIAGNOSIS — N949 Unspecified condition associated with female genital organs and menstrual cycle: Secondary | ICD-10-CM

## 2017-12-24 LAB — POCT WET PREP (WET MOUNT)
CLUE CELLS WET PREP WHIFF POC: NEGATIVE
Trichomonas Wet Prep HPF POC: ABSENT

## 2017-12-24 MED ORDER — HYDROCORTISONE 1 % EX OINT: 1.0000 "application " | TOPICAL_OINTMENT | Freq: Two times a day (BID) | CUTANEOUS | 0 refills | Status: DC

## 2017-12-24 NOTE — Patient Instructions (Addendum)
It was nice seeing you again today [name]!  Please begin applying the hydrocortisone cream vaginally (only external genitalia) twice daily to help with the burning and itching. When the symptoms improve, you can stop using the cream, however do not use for more than one week. If your symptoms have not improved after one week of use, please call to let us know.   If you have any questions or concerns, please feel free to call the clinic.   Be well,  Dr. Avon Gully

## 2017-12-24 NOTE — Progress Notes (Signed)
   Subjective:   Patient: [name]       Birthdate: 05-31-1976       MRN: [id number]      HPI  [name] is a 42 y.o. female presenting for vaginal itching and burning.   Vaginal pruritis/burning Patient had vaginal discharge last week, so went to urgent care 5d ago. Was diagnosed with yeast infection at urgent care and given diflucan. Took first dose 5d ago, then second dose 2d ago. Says that discharge cleared with first dose of diflucan, however she continued to have vaginal irritation. She does not have any discharge now. No dysuria. Endorses abnormal vaginal odor yesterday but otherwise normal. Has not been using any over the counter feminine products, including douches or feminine wipes. Has been putting Vaseline on external genitalia as she says this soothes her skin. Has also been doing baking soda and vinegar sitz baths, most recently Friday. Sometimes puts baby powder on external genitalia but does not use any other products regularly. Denies abd pain, N/V, fevers.   Smoking status reviewed. Patient is never smoker.   Review of Systems See HPI.     Objective:  Physical Exam  Constitutional: She is oriented to person, place, and time. She appears well-developed and well-nourished. No distress.  HENT:  Head: Normocephalic and atraumatic.  Cardiovascular: Normal rate.  Pulmonary/Chest: Effort normal. No respiratory distress.  Genitourinary: Vagina normal. No vaginal discharge found.  Genitourinary Comments: No lichenification  Neurological: She is alert and oriented to person, place, and time.  Skin: Skin is warm and dry.  Psychiatric: She has a normal mood and affect. Her behavior is normal.      Assessment & Plan:  Vaginal itching Etiology unclear. Possibly lasting irritation after resolution of yeast infection. Possibly vulvar dermatitis in response to an irritant, though no obvious irritant identified from patient's history. No lichenification or other concerning  findings on pelvic exam. Wet prep neg. Will treat with topical hydrocortisone for no longer than one week. If still symptomatic after one week of use, patient to call office.  Precepted with Dr. Erin Hearing.   Adin Hector, MD, MPH PGY-3 [facility] Pager [phone]

## 2017-12-24 NOTE — Assessment & Plan Note (Signed)
Etiology unclear. Possibly lasting irritation after resolution of yeast infection. Possibly vulvar dermatitis in response to an irritant, though no obvious irritant identified from patient's history. No lichenification or other concerning findings on pelvic exam. Wet prep neg. Will treat with topical hydrocortisone for no longer than one week. If still symptomatic after one week of use, patient to call office.

## 2018-04-24 ENCOUNTER — Other Ambulatory Visit: Payer: Self-pay

## 2018-04-24 ENCOUNTER — Ambulatory Visit (INDEPENDENT_AMBULATORY_CARE_PROVIDER_SITE_OTHER): Payer: Medicaid Other | Admitting: Family Medicine

## 2018-04-24 ENCOUNTER — Ambulatory Visit: Payer: Medicaid Other

## 2018-04-24 VITALS — BP 115/70 | Temp 98.4°F | Wt 163.4 lb

## 2018-04-24 DIAGNOSIS — R21 Rash and other nonspecific skin eruption: Secondary | ICD-10-CM

## 2018-04-24 DIAGNOSIS — T63481A Toxic effect of venom of other arthropod, accidental (unintentional), initial encounter: Secondary | ICD-10-CM

## 2018-04-24 MED ORDER — LORATADINE 10 MG PO TBDP: 10.0000 mg | ORAL_TABLET | Freq: Every day | ORAL | 3 refills | Status: DC

## 2018-04-24 NOTE — Patient Instructions (Signed)
Get a topical anti-itch cream over the counter.  Use a bug spray to prevent future bites.  These areas will get better on their own.

## 2018-04-24 NOTE — Progress Notes (Signed)
    Subjective:  [name] is a 42 y.o. female who presents to the [facility] today with a chief complaint of bumps.   HPI:  Patient states that she has had several bumps all over her body over the last week.  She initially had one on her R shoulder that was very itchy and then went away after a few days. She also had had similar bumps on her L forearm and under her chin and now has own on her L cheek that popped yesterday.  No voice changes. No shortness of breath. No issues swallowing. No lip swelling. No pain. No fever  ROS: Per HPI   Objective:  Physical Exam: BP 115/70   Temp 98.4 F (36.9 C) (Oral)   Wt 163 lb 6.4 oz (74.1 kg)   SpO2 98%   BMI 36.63 kg/m   Gen: NAD, resting comfortably HEENT: [place], AT. Oropharnx nonerythematous. Gums and mucosa normal Pulm: NWOB on room air Skin: several erythematous papules each about 2cm in diameter, one on L forearm, another under chin, and nother on L cheek. Neuro: grossly normal, moves all extremities Psych: Normal affect and thought content   Assessment/Plan:  1. Local reaction to insect sting, accidental or unintentional, initial encounter Patient is having local reaction to insect bite that is self limited. Advised symptomatic management with antihistamines. Recommended use of insect repellent. Given return precautions.   Bufford Lope, DO PGY-3, [facility] 04/24/2018 11:23 AM

## 2018-04-27 IMAGING — DX DG LUMBAR SPINE COMPLETE 4+V
5 series · 5 of 5 positions shown · non-contrast
Comparison: None.

CLINICAL DATA: Low back, sacral, and left hip pain for several
days. No known injury.

EXAM:
LUMBAR SPINE - COMPLETE 4+ VIEW

[dg lumbar spine complete 4 +v (1 of 5)]
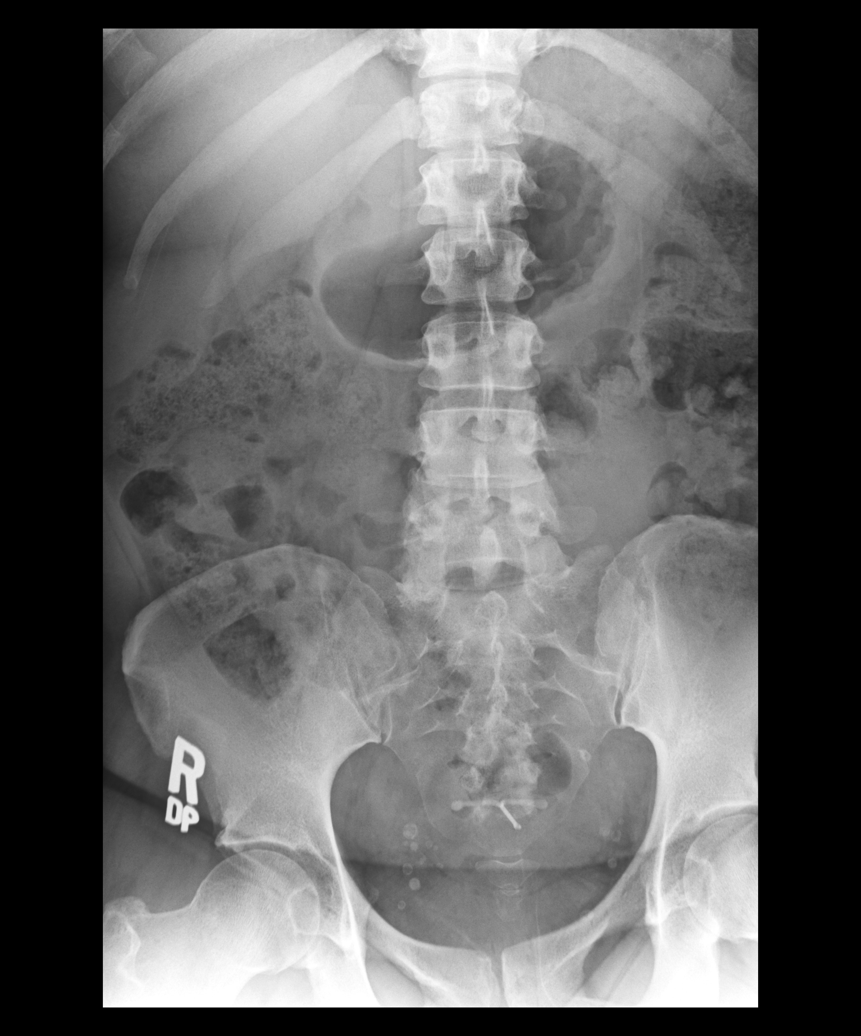

[dg lumbar spine complete 4 +v (2 of 5)]
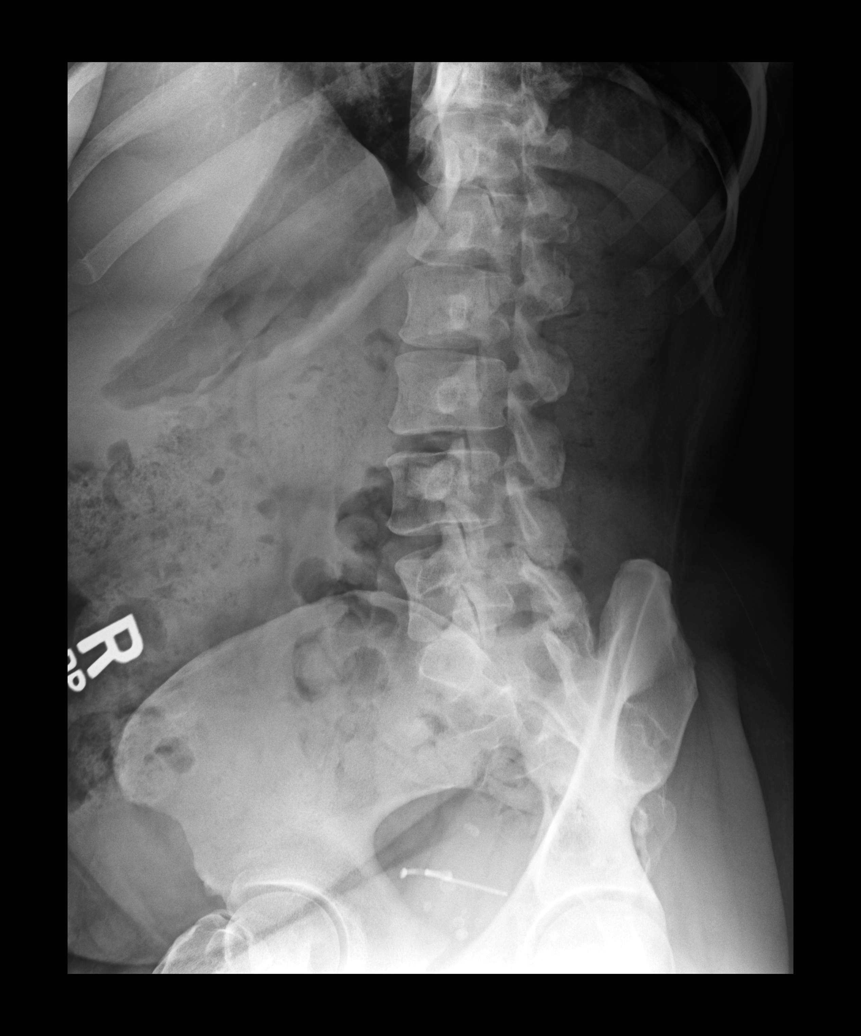

[dg lumbar spine complete 4 +v (3 of 5)]
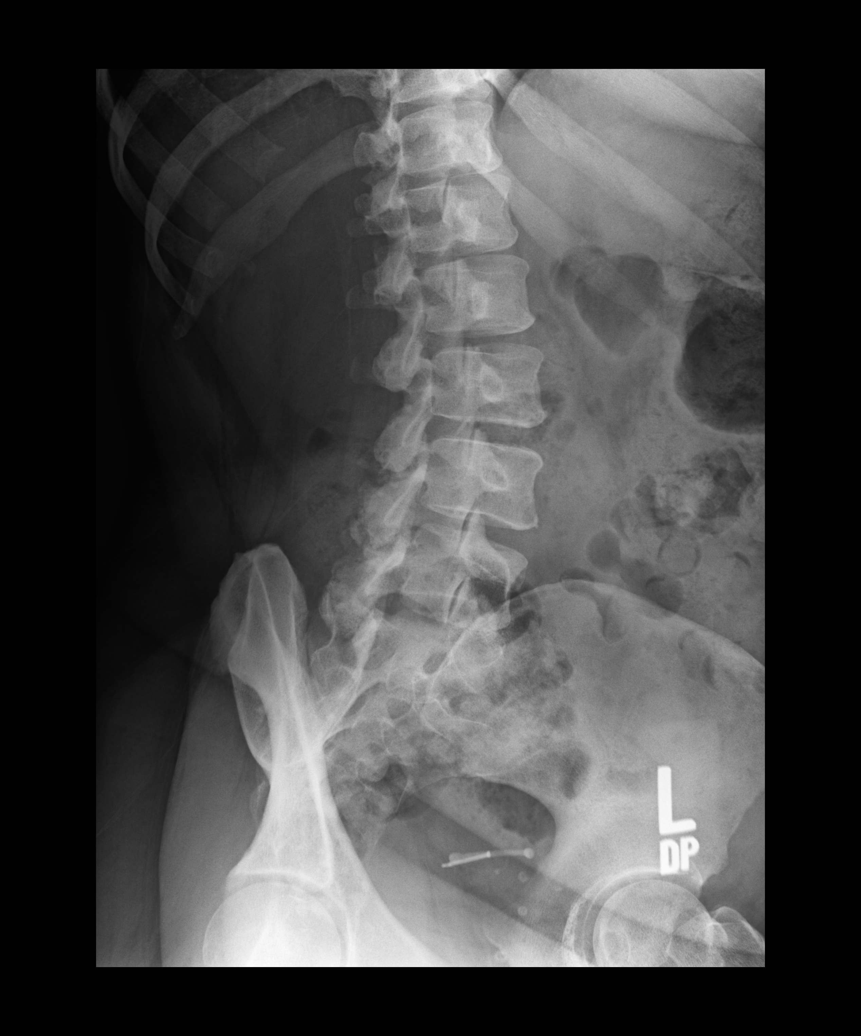

[dg lumbar spine complete 4 +v (4 of 5)]
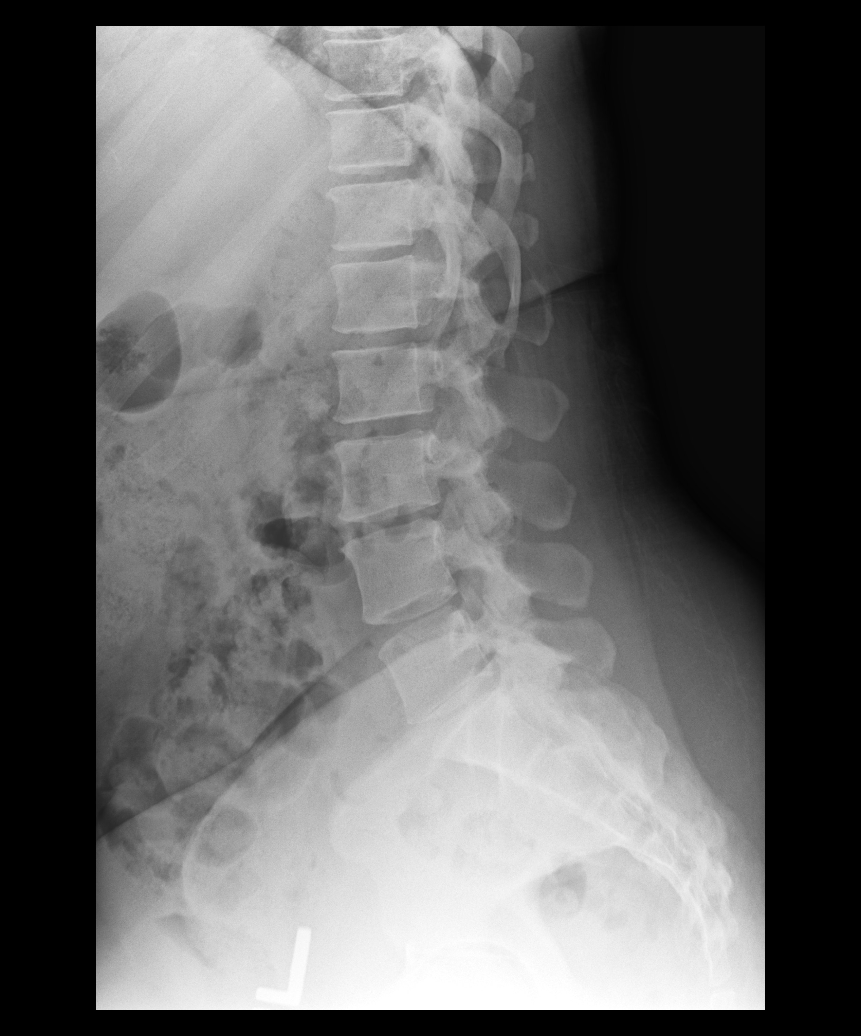

[dg lumbar spine complete 4 +v (5 of 5)]
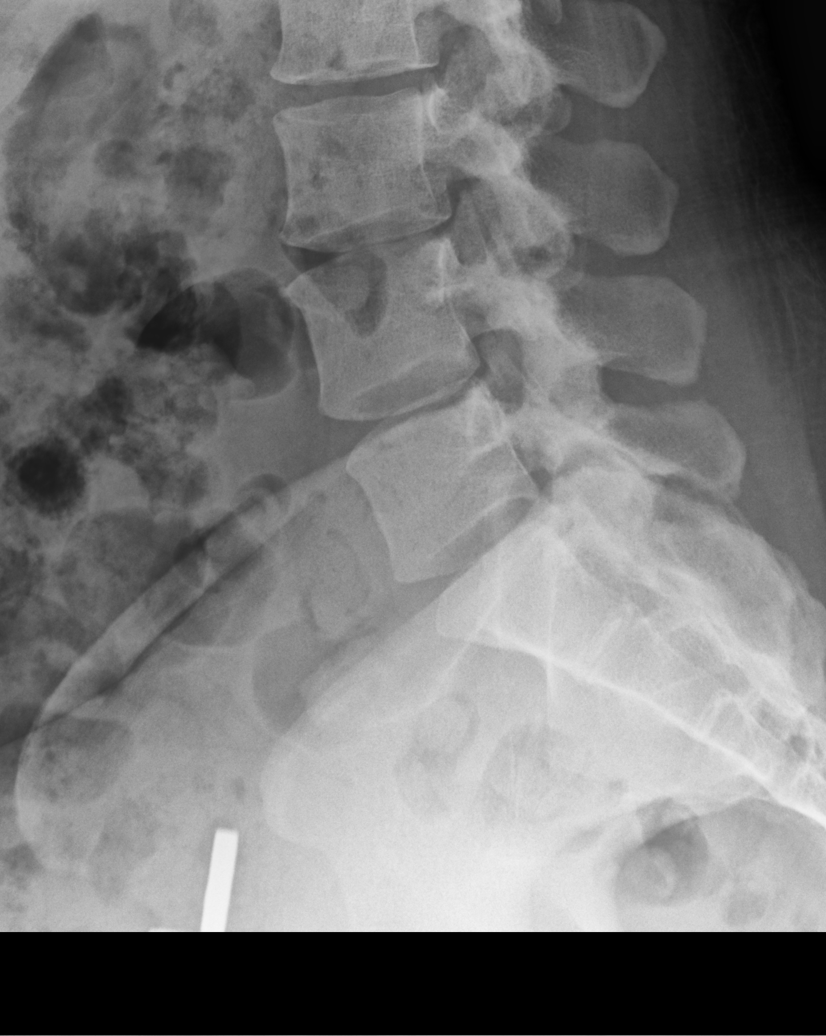

[5 of 5 positions shown; findings below may reference images not displayed]

FINDINGS: There is no evidence of lumbar spine fracture. Alignment is normal.
Mild degenerative disc disease is seen at L3-4. Mild right-sided
facet DJD is seen at L4-5 and L5-S1. No other bone lesions
identified. Pelvic IUD noted.
IMPRESSION: No acute findings. Mild degenerative spondylosis, as described
above.

## 2018-04-28 ENCOUNTER — Ambulatory Visit (INDEPENDENT_AMBULATORY_CARE_PROVIDER_SITE_OTHER): Payer: Medicaid Other

## 2018-04-28 DIAGNOSIS — Z111 Encounter for screening for respiratory tuberculosis: Secondary | ICD-10-CM | POA: Diagnosis not present

## 2018-04-28 NOTE — Progress Notes (Signed)
   Tuberculin skin test applied to right ventral forearm. Appointment made for 05/01/18 for PPD reading.  Danley Danker, RN [facility] RN)

## 2018-05-01 ENCOUNTER — Ambulatory Visit: Payer: Medicaid Other

## 2018-05-01 DIAGNOSIS — Z111 Encounter for screening for respiratory tuberculosis: Secondary | ICD-10-CM

## 2018-05-01 LAB — TB SKIN TEST: TB SKIN TEST: NEGATIVE

## 2018-05-01 NOTE — Progress Notes (Signed)
Pt here for PPD reading. Negative result, 38mm induration. Results printed for pt. Wallace Cullens, RN

## 2018-07-21 ENCOUNTER — Telehealth: Payer: Self-pay | Admitting: Family Medicine

## 2018-07-21 ENCOUNTER — Other Ambulatory Visit: Payer: Self-pay | Admitting: Family Medicine

## 2018-07-21 ENCOUNTER — Other Ambulatory Visit: Payer: Self-pay

## 2018-07-21 ENCOUNTER — Encounter: Payer: Self-pay | Admitting: Family Medicine

## 2018-07-21 ENCOUNTER — Ambulatory Visit (INDEPENDENT_AMBULATORY_CARE_PROVIDER_SITE_OTHER): Payer: Medicaid Other | Admitting: Family Medicine

## 2018-07-21 VITALS — BP 118/71 | HR 63 | Temp 97.8°F | Wt 170.0 lb

## 2018-07-21 DIAGNOSIS — N898 Other specified noninflammatory disorders of vagina: Secondary | ICD-10-CM | POA: Diagnosis not present

## 2018-07-21 DIAGNOSIS — R8290 Unspecified abnormal findings in urine: Secondary | ICD-10-CM

## 2018-07-21 DIAGNOSIS — R82998 Other abnormal findings in urine: Secondary | ICD-10-CM

## 2018-07-21 LAB — POCT URINALYSIS DIP (MANUAL ENTRY)
BILIRUBIN UA: NEGATIVE
Blood, UA: NEGATIVE
Glucose, UA: NEGATIVE mg/dL
Ketones, POC UA: NEGATIVE mg/dL
LEUKOCYTES UA: NEGATIVE
NITRITE UA: NEGATIVE
PROTEIN UA: NEGATIVE mg/dL
Spec Grav, UA: 1.025 (ref 1.010–1.025)
UROBILINOGEN UA: 4 U/dL — AB
pH, UA: 6.5 (ref 5.0–8.0)

## 2018-07-21 LAB — POCT WET PREP (WET MOUNT)
CLUE CELLS WET PREP WHIFF POC: NEGATIVE
TRICHOMONAS WET PREP HPF POC: ABSENT

## 2018-07-21 NOTE — Telephone Encounter (Signed)
HIPAA compliant call back message left. Result message was not left.   Note: + Urobilinogen. Need to return for repeat UA in the next few days plus LFT in the setting of a dark urine. Please schedule f/u with PCP in few days for repeat UA and lab. May connect her to me when she calls back.

## 2018-07-21 NOTE — Telephone Encounter (Signed)
Patient called back. I discussed lab result with her. Lab appointment scheduled for CMET and repeat UA. All questions were answered.

## 2018-07-21 NOTE — Progress Notes (Signed)
poc ua

## 2018-07-21 NOTE — Progress Notes (Signed)
Subjective:     Patient ID: [name], female   DOB: Nov 17, 1975, 42 y.o.   MRN: [id number]  Vaginal Discharge  The patient's primary symptoms include genital itching and vaginal discharge. The patient's pertinent negatives include no genital lesions or genital odor. Primary symptoms comment: LMP: IUD in place. This is a new problem. The current episode started in the past 7 days (1 week ago). The problem occurs intermittently. The problem has been unchanged. The patient is experiencing no pain. Pertinent negatives include no abdominal pain, diarrhea, discolored urine, frequency, hematuria, urgency or vomiting. Associated symptoms comments: Dark urine because she does not drink a lot of water. She denies dysuria or other urinary symptoms.. The vaginal discharge was white and milky. There has been no bleeding. She has not been passing clots. She has not been passing tissue. Nothing aggravates the symptoms. She has tried nothing for the symptoms. The treatment provided no relief. She is sexually active (Been with current partner for about 2 yrs). No, her partner does not have an STD. She uses an IUD for contraception. Menstrual history: No period. Her past medical history is significant for an STD. There is no history of PID. (Gonorrhea and chlamydia few years ago nothing recent.)   Current Outpatient Medications on File Prior to Visit  Medication Sig Dispense Refill  . fluconazole (DIFLUCAN) 150 MG tablet 1 tab today, may repeat in 72 hours if symptoms persist 2 tablet 0  . hydrocortisone 1 % ointment Apply 1 application topically 2 (two) times daily. 25 g 0  . loratadine (CLARITIN REDITABS) 10 MG dissolvable tablet Take 1 tablet (10 mg total) by mouth daily. As needed for allergy symptoms 30 tablet 3  . meloxicam (MOBIC) 7.5 MG tablet Take 1 tablet (7.5 mg total) by mouth daily. 30 tablet 0  . scopolamine (TRANSDERM-SCOP) 1 MG/3DAYS Place 1 patch (1.5 mg total) onto the skin every 3 (three) days. 4  patch 0   No current facility-administered medications on file prior to visit.    History reviewed. No pertinent past medical history. Vitals:   07/21/18 1003  BP: 118/71  Pulse: 63  Temp: 97.8 F (36.6 C)  TempSrc: Oral  SpO2: 98%  Weight: 170 lb (77.1 kg)    Review of Systems  Respiratory: Negative.   Cardiovascular: Negative.   Gastrointestinal: Negative for abdominal pain, diarrhea and vomiting.  Genitourinary: Positive for vaginal discharge. Negative for frequency, hematuria and urgency.  All other systems reviewed and are negative.      Objective:   Physical Exam  Constitutional: She appears well-developed. No distress.  Cardiovascular: Normal rate, regular rhythm and normal heart sounds.  No murmur heard. Pulmonary/Chest: Effort normal and breath sounds normal. No stridor. No respiratory distress. She has no wheezes.  Abdominal: Soft. Bowel sounds are normal. She exhibits no distension and no mass. There is no tenderness.  Genitourinary: Uterus normal. Pelvic exam was performed with patient supine. There is no rash or tenderness on the right labia. There is no rash or tenderness on the left labia. Cervix exhibits discharge. Cervix exhibits no motion tenderness. Right adnexum displays no mass and no tenderness. Left adnexum displays no mass and no tenderness. Vaginal discharge found.  Genitourinary Comments: Discharge creamy but likely normal  Nursing note and vitals reviewed.      Assessment:     Vaginitis Change in urine color    Plan:     Wet prep neg. UA shows + Urobilinogen. ?? Liver disorder in the setting  of dark urine. I called and discussed result with her. Repeat UA and Cmet recommended. She will return next week for lab work. F/U with PCP soon for other health concern and health maintenance.

## 2018-07-21 NOTE — Patient Instructions (Signed)

## 2018-07-28 ENCOUNTER — Other Ambulatory Visit (INDEPENDENT_AMBULATORY_CARE_PROVIDER_SITE_OTHER): Payer: Medicaid Other

## 2018-07-28 DIAGNOSIS — R82998 Other abnormal findings in urine: Secondary | ICD-10-CM

## 2018-07-28 LAB — POCT URINALYSIS DIP (MANUAL ENTRY)
Bilirubin, UA: NEGATIVE
Glucose, UA: NEGATIVE mg/dL
Ketones, POC UA: NEGATIVE mg/dL
Leukocytes, UA: NEGATIVE
NITRITE UA: NEGATIVE
PH UA: 6 (ref 5.0–8.0)
PROTEIN UA: NEGATIVE mg/dL
Spec Grav, UA: 1.025 (ref 1.010–1.025)
UROBILINOGEN UA: 0.2 U/dL

## 2018-07-28 LAB — POCT UA - MICROSCOPIC ONLY

## 2018-07-29 LAB — CMP14+EGFR
ALT: 14 IU/L (ref 0–32)
AST: 11 IU/L (ref 0–40)
Albumin/Globulin Ratio: 1.7 (ref 1.2–2.2)
Albumin: 4.6 g/dL (ref 3.5–5.5)
Alkaline Phosphatase: 49 IU/L (ref 39–117)
BILIRUBIN TOTAL: 0.3 mg/dL (ref 0.0–1.2)
BUN / CREAT RATIO: 12 (ref 9–23)
BUN: 10 mg/dL (ref 6–24)
CHLORIDE: 102 mmol/L (ref 96–106)
CO2: 21 mmol/L (ref 20–29)
Calcium: 9.8 mg/dL (ref 8.7–10.2)
Creatinine, Ser: 0.83 mg/dL (ref 0.57–1.00)
GFR, EST AFRICAN AMERICAN: 101 mL/min/{1.73_m2} (ref >59)
GFR, EST NON AFRICAN AMERICAN: 88 mL/min/{1.73_m2} (ref >59)
GLUCOSE: 92 mg/dL (ref 65–99)
Globulin, Total: 2.7 g/dL (ref 1.5–4.5)
Potassium: 4.1 mmol/L (ref 3.5–5.2)
Sodium: 143 mmol/L (ref 134–144)
TOTAL PROTEIN: 7.3 g/dL (ref 6.0–8.5)

## 2018-08-04 ENCOUNTER — Telehealth: Payer: Self-pay

## 2018-08-04 NOTE — Telephone Encounter (Signed)
Initial UA was positive for bilirubin. Repeat UA was normal. Have her follow-up with her PCP for routine health care.

## 2018-08-04 NOTE — Telephone Encounter (Signed)
Pt called nurse line requesting lab draw results from 12/3. I informed pt her labs looked fine, but will double check with the doctor. Pt stated she received a mychart message, however does not know how to get in to read it. Please advise.

## 2018-08-04 NOTE — Telephone Encounter (Signed)
Contacted pt and informed her of normal results and to schedule with pcp.

## 2018-10-11 ENCOUNTER — Other Ambulatory Visit: Payer: Self-pay

## 2018-10-11 ENCOUNTER — Emergency Department (HOSPITAL_BASED_OUTPATIENT_CLINIC_OR_DEPARTMENT_OTHER)
Admission: EM | Admit: 2018-10-11 | Discharge: 2018-10-11 | Disposition: A | Discharge status: Home / Self Care | Payer: Medicaid Other | Attending: Emergency Medicine | Admitting: Emergency Medicine

## 2018-10-11 ENCOUNTER — Emergency Department (HOSPITAL_BASED_OUTPATIENT_CLINIC_OR_DEPARTMENT_OTHER): Payer: Medicaid Other

## 2018-10-11 ENCOUNTER — Encounter (HOSPITAL_BASED_OUTPATIENT_CLINIC_OR_DEPARTMENT_OTHER): Payer: Self-pay | Admitting: Emergency Medicine

## 2018-10-11 DIAGNOSIS — M25511 Pain in right shoulder: Secondary | ICD-10-CM | POA: Insufficient documentation

## 2018-10-11 DIAGNOSIS — Z79899 Other long term (current) drug therapy: Secondary | ICD-10-CM | POA: Diagnosis not present

## 2018-10-11 MED ORDER — MELOXICAM 15 MG PO TABS: 15.0000 mg | ORAL_TABLET | Freq: Every day | ORAL | 0 refills | Status: DC

## 2018-10-11 NOTE — ED Provider Notes (Signed)
[facility] EMERGENCY DEPARTMENT Provider Note   CSN: [id number] Arrival date & time: 10/11/18  1348     History   Chief Complaint Chief Complaint  Patient presents with  . Shoulder Pain    HPI [name] is a 43 y.o. female.  Patient presents the emergency department with complaint of worsening right shoulder pain.  Patient is a bus driver.  She states that she has reduced range of motion in the shoulder chronically.  She denies acute injury.  She has had worsening pain in the anterior, posterior, and lateral shoulder over the past several days.  Pain is worse when she lifts her arm up and she is unable to lift above shoulder height.  No numbness or tingling in the hand.  No neck pain.  Onset of symptoms acute.  Course is constant.  Patient took 15 mg of meloxicam prior to arrival but states that these pills were expired.  She has been prescribed these for arthritis.     History reviewed. No pertinent past medical history.  Patient Active Problem List   Diagnosis Date Noted  . Vaginal itching 12/24/2017  . Coccygeal pain 09/01/2017  . Contraception 09/25/2016  . Hearing loss 07/22/2016  . Knee pain, left 07/22/2016  . Shoulder pain, right 07/22/2016  . Vaginal discharge 05/11/2013  . Obesity (BMI 30.0-34.9) 10/31/2011  . ASCUS PAP 03/16/2009  . CHLAMYDIAL INFECTION, HX OF 02/03/2007    Past Surgical History:  Procedure Laterality Date  . CESAREAN SECTION    . CESAREAN SECTION       OB History   No obstetric history on file.      Home Medications    Prior to Admission medications   Medication Sig Start Date End Date Taking? Authorizing Provider  fluconazole (DIFLUCAN) 150 MG tablet 1 tab today, may repeat in 72 hours if symptoms persist 12/18/17   Augusto Gamble B, NP  hydrocortisone 1 % ointment Apply 1 application topically 2 (two) times daily. 12/24/17   Verner Mould, MD  loratadine (CLARITIN REDITABS) 10 MG dissolvable tablet Take  1 tablet (10 mg total) by mouth daily. As needed for allergy symptoms 04/24/18   Bufford Lope, DO  meloxicam (MOBIC) 15 MG tablet Take 1 tablet (15 mg total) by mouth daily. 10/11/18   Carlisle Cater, PA-C  scopolamine (TRANSDERM-SCOP) 1 MG/3DAYS Place 1 patch (1.5 mg total) onto the skin every 3 (three) days. 07/30/17   Marjie Skiff, MD    Family History Family History  Problem Relation Age of Onset  . Hypertension Mother   . Diabetes Mother   . CAD Mother     Social History Social History   Tobacco Use  . Smoking status: Never Smoker  . Smokeless tobacco: Never Used  Substance Use Topics  . Alcohol use: Yes    Comment: occ  . Drug use: No     Allergies   Patient has no known allergies.   Review of Systems Review of Systems  Constitutional: Negative for activity change.  Musculoskeletal: Positive for arthralgias. Negative for back pain, joint swelling and neck pain.  Skin: Negative for wound.  Neurological: Negative for weakness and numbness.     Physical Exam Updated Vital Signs BP (!) 153/90 (BP Location: Left Arm)   Pulse 65   Temp 98.2 F (36.8 C) (Oral)   Resp 18   Ht 4\' 9"  (1.448 m)   Wt 81.6 kg   SpO2 99%   BMI 38.95 kg/m  Physical Exam Vitals signs and nursing note reviewed.  Constitutional:      Appearance: She is well-developed.  HENT:     Head: Normocephalic and atraumatic.  Eyes:     Pupils: Pupils are equal, round, and reactive to light.  Neck:     Musculoskeletal: Normal range of motion and neck supple.  Cardiovascular:     Pulses: Normal pulses. No decreased pulses.          Radial pulses are 2+ on the right side and 2+ on the left side.  Musculoskeletal:        General: Tenderness present.     Right shoulder: She exhibits decreased range of motion and tenderness. She exhibits no bony tenderness.     Right elbow: Normal.    Right wrist: Normal.     Cervical back: She exhibits normal range of motion, no tenderness and no bony  tenderness.     Right upper arm: She exhibits tenderness. She exhibits no bony tenderness and no swelling.     Right forearm: Normal.       Arms:     Right hand: Normal.  Skin:    General: Skin is warm and dry.  Neurological:     Mental Status: She is alert.     Sensory: No sensory deficit.     Comments: Motor, sensation, and vascular distal to the injury is fully intact.       ED Treatments / Results  Labs (all labs ordered are listed, but only abnormal results are displayed) Labs Reviewed - No data to display  EKG None  Radiology Dg Shoulder Right  Result Date: 10/11/2018 CLINICAL DATA:  Right shoulder pain radiating down the arm. No known injury. EXAM: RIGHT SHOULDER - 2+ VIEW COMPARISON:  None. FINDINGS: Glenohumeral joint appears normal. Normal humeral acromial distance. Some irregularity at the greater tuberosity of the humerus could be seen with rotator cuff disease. Other regional structures appear normal. IMPRESSION: No acute finding. Some bony irregularity at the greater tuberosity of the humerus can be a sign of rotator cuff disease. Electronically Signed   By: Nelson Chimes M.D.   On: 10/11/2018 15:00    Procedures Procedures (including critical care time)  Medications Ordered in ED Medications - No data to display   Initial Impression / Assessment and Plan / ED Course  I have reviewed the triage vital signs and the nursing notes.  Pertinent labs & imaging results that were available during my care of the patient were reviewed by me and considered in my medical decision making (see chart for details).     Patient seen and examined.  Updated on x-ray results.  She will continue NSAIDs.  She is provided with a sling.  Discussed need for gentle stretching during the day and not to wear sling constantly.  Strongly encouraged orthopedic follow-up, referral given.  Discussed use of heat as well.  Vital signs reviewed and are as follows: BP (!) 153/90 (BP Location:  Left Arm)   Pulse 65   Temp 98.2 F (36.8 C) (Oral)   Resp 18   Ht 4\' 9"  (1.448 m)   Wt 81.6 kg   SpO2 99%   BMI 38.95 kg/m     Final Clinical Impressions(s) / ED Diagnoses   Final diagnoses:  Acute pain of right shoulder   Patient with right shoulder pain, possible rotator cuff injury.  Likely overuse injury.  Upper extremity is neurovascularly intact.  NSAIDs, rest, heat indicated with Ortho follow-up.  ED Discharge Orders         Ordered    meloxicam (MOBIC) 15 MG tablet  Daily     10/11/18 1521           Carlisle Cater, Hershal Coria 10/11/18 [place], MD 10/12/18 [phone]

## 2018-10-11 NOTE — ED Triage Notes (Signed)
R shoulder pain x 3 days. States she cannot raise it above her head. Denies injury.

## 2018-10-11 NOTE — Discharge Instructions (Signed)
Please read and follow all provided instructions.  Your diagnoses today include:  1. Acute pain of right shoulder     Tests performed today include:  An x-ray of the affected area - does NOT show any broken bones  Vital signs. See below for your results today.   Medications prescribed:   Meloxicam - anti-inflammatory pain medication  You have been prescribed an anti-inflammatory medication or NSAID. Take with food. Do not take aspirin, ibuprofen, or naproxen if taking this medication. Take smallest effective dose for the shortest duration needed for your pain. Stop taking if you experience stomach pain or vomiting.   Take any prescribed medications only as directed.  Home care instructions:   Follow any educational materials contained in this packet  Follow R.I.C.E. Protocol:  R - rest your injury   I  - use ice on injury without applying directly to skin  C - compress injury with bandage or splint  E - elevate the injury as much as possible  Follow-up instructions: Please follow-up with the provided orthopedic physician (bone specialist) in 1 week.   Return instructions:   Please return if your fingers are numb or tingling, appear gray or blue, or you have severe pain (also elevate the arm and loosen splint or wrap if you were given one)  Please return to the Emergency Department if you experience worsening symptoms.   Please return if you have any other emergent concerns.  Additional Information:  Your vital signs today were: BP (!) 153/90 (BP Location: Left Arm)    Pulse 65    Temp 98.2 F (36.8 C) (Oral)    Resp 18    Ht 4\' 9"  (1.448 m)    Wt 81.6 kg    SpO2 99%    BMI 38.95 kg/m  If your blood pressure (BP) was elevated above 135/85 this visit, please have this repeated by your doctor within one month. --------------

## 2018-10-13 ENCOUNTER — Encounter: Payer: Self-pay | Admitting: Family Medicine

## 2018-10-13 ENCOUNTER — Ambulatory Visit: Payer: Medicaid Other | Admitting: Family Medicine

## 2018-10-13 ENCOUNTER — Other Ambulatory Visit: Payer: Self-pay

## 2018-10-13 VITALS — BP 118/74 | HR 75 | Temp 98.8°F | Ht <= 58 in | Wt 178.6 lb

## 2018-10-13 DIAGNOSIS — S46911A Strain of unspecified muscle, fascia and tendon at shoulder and upper arm level, right arm, initial encounter: Secondary | ICD-10-CM

## 2018-10-13 DIAGNOSIS — M25511 Pain in right shoulder: Secondary | ICD-10-CM | POA: Diagnosis not present

## 2018-10-13 NOTE — Progress Notes (Signed)
Subjective:    [name] is a 43 y.o. female who presents to [facility] today for Right arm pain:  1.  Right arm pain:  Mostly in Right shoulder.  No injury.  Has reduced ROM in that arm chronically.  Started last Wed/Thursday and progressed through the weekend, becoming more severe on Sunday (2 days ago). Patient works as a Recruitment consultant.  Seen in ED on Sunday.  Diagnosed with shoulder pain and possibly overuse/rotator cuff arthropathy.     No numbness, tingling, neck pain.  Has been taking Mobic without relief, although these have been expired.  Received refill at ED.  Also provided sling at ED and told to FU with Orthopedics.    Since Sunday: Has been gradually improving.  Still feels "swollen" at nighttime but not necessarily during the day.  Worse when she attempts to drive her car.  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  If she is just resting she does not have any pain.  She has been taking her Mobic which also helps with pain relief.  Using heating pad at night.  No weakness in lower arm or hand.  No real swelling in her arm or hand.  No redness or rash that she has noted around her shoulder or arm.  ROS as above per HPI.    The following portions of the patient's history were reviewed and updated as appropriate: allergies, current medications, past medical history, family and social history, and problem list. Patient is a nonsmoker.    PMH reviewed.  No past medical history on file. Past Surgical History:  Procedure Laterality Date  . CESAREAN SECTION    . CESAREAN SECTION      Medications reviewed. Current Outpatient Medications  Medication Sig Dispense Refill  . fluconazole (DIFLUCAN) 150 MG tablet 1 tab today, may repeat in 72 hours if symptoms persist 2 tablet 0  . hydrocortisone 1 % ointment Apply 1 application topically 2 (two) times daily. 25 g 0  . loratadine (CLARITIN REDITABS) 10 MG dissolvable tablet Take 1 tablet (10 mg total) by mouth  daily. As needed for allergy symptoms 30 tablet 3  . meloxicam (MOBIC) 15 MG tablet Take 1 tablet (15 mg total) by mouth daily. 10 tablet 0  . scopolamine (TRANSDERM-SCOP) 1 MG/3DAYS Place 1 patch (1.5 mg total) onto the skin every 3 (three) days. 4 patch 0   No current facility-administered medications for this visit.      Objective:   Physical Exam BP 118/74   Pulse 75   Temp 98.8 F (37.1 C) (Oral)   Ht 4\' 9"  (1.448 m)   Wt 178 lb 9.6 oz (81 kg)   SpO2 96%   BMI 38.65 kg/m  Gen:  Alert, cooperative patient who appears stated age in no acute distress.  Vital signs reviewed. HEENT: EOMI,  MMM Cardiac:  Regular rate and rhythm without murmur auscultated.  Good S1/S2. Pulm:  Clear to auscultation bilaterally with good air movement.  No wheezes or rales noted.   MSK: Left shoulder within normal limits. -Right shoulder: No bruising, rash, effusion noted.  Able to lift arms to about 100 degrees active range of motion forward.  Difficulty raising her right arm above her head active range of motion secondary to pain.  Does not lift arm laterally above 90 degrees secondary to pain.  Passive range of motion is good.  Tenderness only along superior aspect of shoulder.  Strength is 4-5  right shoulder.  She would not participate in external and internal rotation testing of her shoulder secondary to pain.  Again good passive range of motion however.

## 2018-10-13 NOTE — Patient Instructions (Signed)
It was good to see you today.  I do think you have a rotator cuff tendon strain.  I will send you down to sports medicine for a more definitive diagnosis.  They will call you with an appointment.  Keep taking the meloxicam as you have been.   Use heat 20 minutes on, 20 minutes off to keep movement in the shoulder  Do the arm circles (10 times each direction) and wall crawls 3 times a day.

## 2018-10-15 ENCOUNTER — Encounter: Payer: Self-pay | Admitting: Sports Medicine

## 2018-10-15 ENCOUNTER — Encounter: Payer: Self-pay | Admitting: Family Medicine

## 2018-10-15 ENCOUNTER — Ambulatory Visit: Payer: Self-pay

## 2018-10-15 ENCOUNTER — Ambulatory Visit: Payer: BC Managed Care – PPO | Admitting: Sports Medicine

## 2018-10-15 VITALS — BP 112/78 | Ht <= 58 in | Wt 178.0 lb

## 2018-10-15 DIAGNOSIS — M25511 Pain in right shoulder: Secondary | ICD-10-CM | POA: Insufficient documentation

## 2018-10-15 MED ORDER — METHYLPREDNISOLONE ACETATE 40 MG/ML IJ SUSP
40.0000 mg | Freq: Once | INTRAMUSCULAR | Status: AC
  Administered 2018-10-15: 40 mg via INTRA_ARTICULAR

## 2018-10-15 NOTE — Progress Notes (Addendum)
[name] - 43 y.o. female MRN [id number]  Date of birth: 09/10/1975   SUBJECTIVE:     Chief Complaint: right shoulder pain  HPI:  [name] is a 43 yo female who presents today for initial evaluation by a sports medicine physician for acute on chronic right shoulder pain. The pain was initially felt first in 2008 but has remained manageable since that time. 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 Upon awakening on the 16th of this month she experienced extreme difficulty with moving the right shoulder due to pain. This pain was particularly troubling and by mid morning had prompted her to visit the local ER as she was concerned with mild associated swelling of the right arm. The swelling was not observed by the ER physician who obtained an Xray which demonstrated only chronic irregularity of the greater tuberosity, absent fracture. She responded minimally to meloxicam 15mg  daily and immobilization in a sling. She denied numbness of tingling of the fingers, loss of strength of the hands, neck pain, chest pain, back pain, palpitations, fever, traumatic event, unusual activity with the right arm in the week preceding the event or other change in behavior or pattern initial to noting the right shoulder pain. Reports pain with movement in all planes and is unable to reach overhead due to pain. Pain with laying on her shoulder  ROS:     See HPI  PERTINENT  PMH / PSH FH / / SH:  Past Medical, Surgical, Social, and Family History Reviewed & Updated in the EMR.  Pertinent findings include:  Hx of remote right shoulder pain with overuse  OBJECTIVE: BP 112/78   Ht 4\' 9"  (1.448 m)   Wt 178 lb (80.7 kg)   BMI 38.52 kg/m   Physical Exam:  Vital signs are reviewed.  GEN: Alert and oriented, NAD Pulm: Breathing unlabored PSY: normal mood, congruent  affect  Right Shoulder: No obvious deformity or asymmetry. No bruising. No swelling Diffuse TTP ROM: limited to 80 degrees flexion and abduction due to pain. 45 degrees ER secondary to pain. Slightly limited IR due to apin NV intact distally Special Tests:  - Impingement: POSITIVE Hawkins and Neers.  - Supraspinatus: POSITIVE empty can.  4/5 strength - Infraspinatus/Teres: 4+5 strength with ER - Subscapularis: POSITIVE belly press. 4+/5 strength with IR - Biceps tendon: POSITIVE Speeds.   Ultrasound of Right Shoulder  BT short: Small split tear in biceps tendon without tenosynovitis BT long: normal appearance, intact Supraspinatus tendon: chronic degenerative changes no focal tears, no abnormal fluid within the bursa Subscapularis tendon: chronic degenerative changes, mild thickening in appearance Infraspinatus tendon:  normal appearance without tears or degenerative changes GH joint: normal appearing posterior labrum AC joint: normal appearance, very mild arthritic changes  Summary and Additional findings-chronic RTC tendinopathy. Small split tear in biceps tendon without tenosynovitis  Left Shoulder: No obvious deformity or asymmetry. No bruising. No swelling No TTP Full ROM in flexion, abduction, internal/external rotation NV intact distally 5/5 strength with RTC testing      ASSESSMENT & PLAN:  1. 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 Given her degree of pain and limited ROM, cannot r/o adhesive capsulitis at this time either  -  Steroid injection performed today - Recommend discontinuing the sling and focus on improved ROM - Home exercises -  work note for light duty until f/u - Return in 4 weeks   I was the preceptor for this visit and available for immediate consultation Mandy Cleverly, DO

## 2018-10-16 MED ORDER — DICLOFENAC SODIUM 75 MG PO TBEC: 75.0000 mg | DELAYED_RELEASE_TABLET | Freq: Two times a day (BID) | ORAL | 0 refills | Status: DC | PRN

## 2018-11-12 ENCOUNTER — Other Ambulatory Visit: Payer: Self-pay

## 2018-11-12 ENCOUNTER — Encounter: Payer: Self-pay | Admitting: Sports Medicine

## 2018-11-12 ENCOUNTER — Ambulatory Visit: Payer: Medicaid Other | Admitting: Sports Medicine

## 2018-11-12 VITALS — BP 110/70 | Ht <= 58 in | Wt 178.4 lb

## 2018-11-12 DIAGNOSIS — M25511 Pain in right shoulder: Secondary | ICD-10-CM | POA: Diagnosis not present

## 2018-11-12 NOTE — Progress Notes (Signed)
  [name] - 43 y.o. female MRN [id number]  Date of birth: May 18, 1976    SUBJECTIVE:      Chief Complaint: Right shoulder pain  HPI:  43 year old female here for follow-up for right shoulder pain.  She received a subacromial steroid injection and home exercises for treatment of rotator cuff and proximal biceps tendinopathy.  Overall she reports 85% improvement in her symptoms.  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  She denies any swelling erythema.  No distal numbness or tingling.  No skin changes.  She is not taking any other pain medications.  Her pain is not affecting her sleep.   ROS:     See HPI. All other reviewed systems negative.  PERTINENT  PMH / PSH FH / / SH:  Past Medical, Surgical, Social, and Family History Reviewed & Updated in the EMR.   OBJECTIVE: BP 110/70   Ht 4\' 8"  (1.422 m)   Wt 178 lb 6.4 oz (80.9 kg)   BMI 40.00 kg/m   Physical Exam:  Vital signs are reviewed.  GEN: Alert and oriented, NAD Pulm: Breathing unlabored PSY: normal mood, congruent affect  MSK: Right shoulder: No obvious deformity or asymmetry. No bruising. No swelling Very mild tenderness over the proximal biceps tendon Full ROM in flexion, abduction, internal/external rotation pain NV intact distally Special Tests:  - Impingement: Neg Hawkins and Neers.  - Supraspinatus: Negative empty can.  5/5 strength - Infraspinatus/Teres: 5/5 strength with ER - Subscapularis: 5/5 strength with IR - Biceps tendon: Negative Speeds.     ASSESSMENT & PLAN:  1.  Right shoulder pain secondary to rotator cuff and biceps tendinopathy.  Overall she is greatly improved. -Continue home exercises - Tylenol or NSAIDs as needed for pain (Patient has previous prescriptions of both diclofenac and meloxicam) -Follow-up as needed  I was the preceptor for this visit and available for immediate consultation Shellia Cleverly, DO

## 2018-12-28 ENCOUNTER — Encounter: Payer: Self-pay | Admitting: Family Medicine

## 2018-12-28 ENCOUNTER — Other Ambulatory Visit: Payer: Self-pay

## 2018-12-28 ENCOUNTER — Ambulatory Visit (INDEPENDENT_AMBULATORY_CARE_PROVIDER_SITE_OTHER): Payer: BC Managed Care – PPO | Admitting: Family Medicine

## 2018-12-28 VITALS — BP 124/72 | HR 74

## 2018-12-28 DIAGNOSIS — N898 Other specified noninflammatory disorders of vagina: Secondary | ICD-10-CM | POA: Diagnosis not present

## 2018-12-28 DIAGNOSIS — L600 Ingrowing nail: Secondary | ICD-10-CM

## 2018-12-28 LAB — POCT WET PREP (WET MOUNT)
Clue Cells Wet Prep Whiff POC: POSITIVE
Trichomonas Wet Prep HPF POC: ABSENT

## 2018-12-28 MED ORDER — METRONIDAZOLE 500 MG PO TABS: 500.0000 mg | ORAL_TABLET | Freq: Two times a day (BID) | ORAL | 0 refills | Status: DC

## 2018-12-28 NOTE — Progress Notes (Signed)
   Subjective:    [name] - 43 y.o. female MRN [id number]  Date of birth: [birth year]  CC:  [name] is here for abnormal vaginal discharge and a painful right great toe.   HPI:  Abnormal vaginal discharge: - Started yesterday with a different smell and a gray color  - similar to the last time she had bacterial vaginosis - not worried about STIs or pregnancy - she thinks it is time for removal of her Mirena, and she would like to have a new Mirena placed when the current one is removed.  She does not have periods due to Mirena  R great toe soreness:  - first noticed soreness on the lateral part of her toenail one week ago - cut toenail yesterday - crack in toenail for a couple of weeks, unsure if it is connected - no known trauma to the R toe - had ingrown toenails when she was a child and had them cut out; this does not feel similar  Health Maintenance: Last Pap smear was in 2016 and had normal cytology and no high risk HPV.  Next Pap smear is due in 2021. There are no preventive care reminders to display for this [name].  -  reports that she has never smoked. She has never used smokeless tobacco. - Review of Systems: Per HPI. - Past Medical History: [name] Active Problem List   Diagnosis Date Noted  . Ingrown toenail of right foot 12/28/2018  . Right shoulder pain 10/15/2018  . Vaginal itching 12/24/2017  . Coccygeal pain 09/01/2017  . Contraception 09/25/2016  . Hearing loss 07/22/2016  . Knee pain, left 07/22/2016  . Shoulder pain, right 07/22/2016  . Vaginal discharge 05/11/2013  . Obesity (BMI 30.0-34.9) 10/31/2011  . ASCUS PAP 03/16/2009  . CHLAMYDIAL INFECTION, HX OF 02/03/2007   - Medications: reviewed and updated   Objective:   Physical Exam BP 124/72   Pulse 74   SpO2 98%  Gen: NAD, alert, cooperative with exam, well-appearing CV: RRR, good S1/S2, no murmur, no edema, capillary refill brisk  Resp: CTABL, no wheezes, non-labored  Extremities: Very slight erythema along lateral nail fold of right great toe that is slightly tender to palpation.  Cannot visualize nail growing into skin of right great toe.  Does not appear to be infected.  Toenails trimmed straight across.  Normal range of motion Neuro: no gross deficits.  Psych: good insight, alert and oriented        Assessment & Plan:   Vaginal discharge [name] self swabbed for wet prep.  Wet prep was positive for clue cells.  [name] was prescribed metronidazole 500 mg twice daily for 7 days and instructed to take the full course of this medication and should not drink alcohol while taking this medication.  Ingrown toenail of right foot Likely at the beginning stages of an ingrown toenail.  Encouraged [name] to soak her toe in warm water daily and to wear shoes that have plenty of room for her toes.  Asked [name] to watch this area closely and to let us know if her pain worsens.  She was advised to make an appointment to have her IUD removed and a new one placed.   Maia Breslow, M.D. 12/28/2018, 3:26 PM PGY-2, [place]

## 2018-12-28 NOTE — Assessment & Plan Note (Signed)
Likely at the beginning stages of an ingrown toenail.  Encouraged patient to soak her toe in warm water daily and to wear shoes that have plenty of room for her toes.  Asked patient to watch this area closely and to let us know if her pain worsens.

## 2018-12-28 NOTE — Assessment & Plan Note (Signed)
Patient self swabbed for wet prep.  Wet prep was positive for clue cells.  Patient was prescribed metronidazole 500 mg twice daily for 7 days and instructed to take the full course of this medication and should not drink alcohol while taking this medication.

## 2018-12-28 NOTE — Patient Instructions (Signed)
It was nice meeting you today Ms. Legrande!  You have bacterial vaginosis.  Please take metronidazole twice daily for 7 days.  Please do not drink alcohol with this medication because it will make you feel very sick if you do.  Please soak your foot in warm water to soften the skin and continue to cut your toenails straight across.  Please wear shoes that allow the most room for that toe.  Please let us know if this area worsens.  Please make an appointment to get your current IUD removed and a new one placed in the office.  If you have any questions or concerns, please feel free to call the clinic.   Be well,  Dr. Shan Levans

## 2019-01-19 ENCOUNTER — Ambulatory Visit (INDEPENDENT_AMBULATORY_CARE_PROVIDER_SITE_OTHER): Payer: BC Managed Care – PPO | Admitting: Family Medicine

## 2019-01-19 ENCOUNTER — Other Ambulatory Visit: Payer: Self-pay

## 2019-01-19 VITALS — BP 116/64 | HR 87

## 2019-01-19 DIAGNOSIS — R300 Dysuria: Secondary | ICD-10-CM

## 2019-01-19 LAB — POCT URINALYSIS DIP (MANUAL ENTRY)
Glucose, UA: NEGATIVE mg/dL
Nitrite, UA: NEGATIVE
Protein Ur, POC: 100 mg/dL — AB
Spec Grav, UA: 1.025 (ref 1.010–1.025)
Urobilinogen, UA: 0.2 E.U./dL
pH, UA: 6 (ref 5.0–8.0)

## 2019-01-19 MED ORDER — CEPHALEXIN 500 MG PO CAPS: 500.0000 mg | ORAL_CAPSULE | Freq: Three times a day (TID) | ORAL | 0 refills | Status: AC

## 2019-01-19 MED ORDER — FLUCONAZOLE 150 MG PO TABS: 150.0000 mg | ORAL_TABLET | Freq: Once | ORAL | 0 refills | Status: AC

## 2019-01-19 NOTE — Assessment & Plan Note (Signed)
Patient present with two days of dysuria, increase frequency and hesitancy Symptoms have worsened today despite conservative treatment. UA showed +2 moderate leukocytes and no nitrites. Findings and symptoms consistent with UTI. Will treat with: --Keflex 500 mg tid for 7 days  --Will also prescribe diflucan as patient usually has yeast vaginitis post treatment --Follow up as needed

## 2019-01-19 NOTE — Progress Notes (Addendum)
   Subjective:    Patient ID: [name], female    DOB: 02-16-1976, 43 y.o.   MRN: [id number]   CC: dysuria and increase frequency  HPI: Patient is a 43 yo female who presents today complaining of increase urinary frequency, hesitancy and dysuria for the past 2 days. Patient reports she has had similar symptoms in the past. She has been drinking a lot of water in the past day  As well as cranberry juice, however this morning burning with urination and hesitancy have worsens. She was recently treated for BV and has completed her treatment with Metronidazole. She is sexually active with one partner at the moment.  Smoking status reviewed   ROS: all other systems were reviewed and are negative other than in the HPI   No past medical history on file.  Past Surgical History:  Procedure Laterality Date  . CESAREAN SECTION    . CESAREAN SECTION      Past medical history, surgical, family, and social history reviewed and updated in the EMR as appropriate.  Objective:  BP 116/64   Pulse 87   SpO2 97%   Vitals and nursing note reviewed  General: NAD, pleasant, able to participate in exam Cardiac: RRR, normal heart sounds, no murmurs. 2+ radial and PT pulses bilaterally Respiratory: CTAB, normal effort, No wheezes, rales or rhonchi Abdomen: soft, nontender, nondistended, no hepatic or splenomegaly, +BS Extremities: no edema or cyanosis. WWP. GU: Deferred Skin: warm and dry, no rashes noted Neuro: alert and oriented x4, no focal deficits Psych: Normal affect and mood   Assessment & Plan:   Dysuria Patient present with two days of dysuria, increase frequency and hesitancy Symptoms have worsened today despite conservative treatment. UA showed +2 moderate leukocytes and no nitrites. Findings and symptoms consistent with UTI. Will treat with: --Keflex 500 mg tid for 7 days  --Will also prescribe diflucan as patient usually has yeast vaginitis post treatment --Follow up as needed     Marjie Skiff, MD [place] PGY-3

## 2019-01-19 NOTE — Patient Instructions (Signed)
Acute Urinary Retention, Female    Acute urinary retention means that you cannot pee (urinate) at all, or that you pee too little and your bladder is not emptied completely. If it is not treated, it can lead to kidney damage or other serious problems.  Follow these instructions at home:   Take over-the-counter and prescription medicines only as told by your doctor. Ask your doctor what medicines you should stay away from. Do not take any medicine unless your doctor says it is okay to do so.   If you were sent home with a tube that drains pee from the bladder (catheter), take care of it as told by your doctor.   Drink enough fluid to keep your pee clear or pale yellow.   If you were given an antibiotic, take it as told by your doctor. Do not stop taking the antibiotic even if you start to feel better.   Do not use any products that contain nicotine or tobacco, such as cigarettes and e-cigarettes. If you need help quitting, ask your doctor.   Watch for changes in your symptoms. Tell your doctor about them.   If told, keep track of any changes in your blood pressure at home. Tell your doctor about them.   Keep all follow-up visits as told by your doctor. This is important.  Contact a doctor if:   You have spasms or you leak pee when you have spasms.  Get help right away if:   You have chills or a fever.   You have blood in your pee.   You have a tube that drains the bladder and:  ? The tube stops draining pee.  ? The tube falls out.  Summary   Acute urinary retention means that you cannot pee at all, or that you pee too little and your bladder is not emptied completely. If it is not treated, it can result in kidney damage or other serious problems.   If you were sent home with a tube that drains pee from the bladder, take care of it as told by your doctor.   Pay attention to any changes in your symptoms. Tell your doctor about them.  This information is not intended to replace advice given to you by your  health care provider. Make sure you discuss any questions you have with your health care provider.  Document Released: 01/29/2008 Document Revised: 09/13/2016 Document Reviewed: 09/13/2016  Elsevier Interactive Patient Education  2019 Elsevier Inc[employer]

## 2019-06-03 ENCOUNTER — Ambulatory Visit (INDEPENDENT_AMBULATORY_CARE_PROVIDER_SITE_OTHER): Payer: BC Managed Care – PPO | Admitting: Family Medicine

## 2019-06-03 ENCOUNTER — Other Ambulatory Visit: Payer: Self-pay

## 2019-06-03 DIAGNOSIS — Z3049 Encounter for surveillance of other contraceptives: Secondary | ICD-10-CM | POA: Diagnosis not present

## 2019-06-03 NOTE — Progress Notes (Signed)
   [facility] Phone: [phone]     [name] - 43 y.o. female MRN [id number]  Date of birth: 03-Jun-1976  Subjective:   cc: Birth control  HPI:  Birth control: Patient is here to get her Mirena replaced.  The last time she had her Mirena inserted was April 2015.  She had one episode of light spotting immediately after, but has not had a menstrual cycle since that time.  Currently sexually active with the same partner for the last 3 years.  Denies vaginal pruritus, pain, discharge.  Does not want to get testing for STDs at this time.  ROS: See HPI for pertinent positives and negatives  Past Medical History reviewed  Family history reviewed for today's visit. No changes.   Objective:   BP 120/65   Pulse 69   Wt 176 lb 12.8 oz (80.2 kg)   SpO2 97%   BMI 39.64 kg/m  Gen: NAD, alert and oriented, cooperative with exam Psych: Appropriate behavior  Assessment/Plan:   Contraception management Patient wants Mirena replaced.  Has been 5 years last April.  Denies vaginal symptoms.  Monogamous relationship for past 3 years.  Has not had menstrual cycle in 5 years.  Discussed with patient that Mirena is now good for 6 years, and that she can have this replaced in the next 6 months at any time.  Informed her she will need to make appointment with our [place] clinic for this procedure.  Follow-up as needed.  Clemetine Marker, MD PGY-2 [facility]

## 2019-06-03 NOTE — Patient Instructions (Signed)
Your Mirena is effective until April 2021.  You have between now and then to get it replaced with another one.  You will need to call our clinic and asked to be scheduled in the "colpo clinic".  This is where we do a gynecological procedure such as this.  If you start to notice that you are having periods between now and April, it may be best to schedule an appointment to have your Mirena removed then.  Have a great day,  Clemetine Marker, MD

## 2019-06-03 NOTE — Assessment & Plan Note (Signed)
Patient wants Mirena replaced.  Has been 5 years last April.  Denies vaginal symptoms.  Monogamous relationship for past 3 years.  Has not had menstrual cycle in 5 years.  Discussed with patient that Mirena is now good for 6 years, and that she can have this replaced in the next 6 months at any time.  Informed her she will need to make appointment with our [place] clinic for this procedure.  Follow-up as needed.

## 2019-06-03 NOTE — Progress Notes (Deleted)
  Subjective  CC: No chief complaint on file.  [id number] S Hamburger is a 43 y.o. female who presents today with the following problems:  VAGINAL DISCHARGE  Onset: ***   Description: ***  Odor: ***   Itching: ***  Symptoms Dysuria: {YES/NO/WILD CARDS:18581}  Bleeding: {YES/NO/WILD CARDS:18581}  Pelvic pain: {YES/NO/WILD CARDS:18581}  Back pain: {YES/NO/WILD CARDS:18581}  Fever: {YES/NO/WILD CARDS:18581}  Genital sores: {YES/NO/WILD CARDS:18581}  Rash: {YES/NO/WILD CARDS:18581}  Dyspareunia: {YES/NO/WILD CARDS:18581}  GI Sxs: {YES/NO/WILD CARDS:18581}  Prior treatment: {YES/NO/WILD CARDS:18581}   Red Flags: Missed period: {YES/NO/WILD RC:[id number]  Pregnancy: {YES/NO/WILD CARDS:18581}  Recent antibiotics: {YES/NO/WILD RC:4691767  Sexual activity: {YES/NO/WILD CARDS:18581}  Possible STD exposure: {YES/NO/WILD CARDS:18581}  IUD: {YES/NO/WILD CARDS:18581}  Diabetes: {YES/NO/WILD CARDS:18581}     ***  Pertinent P/F/SHx: *** ROS: Pertinent ROS included in HPI. Objective  Physical Exam:  BP 120/65   Pulse 69   Wt 176 lb 12.8 oz (80.2 kg)   SpO2 97%   BMI 39.64 kg/m   Gen: ***NAD, resting comfortably CV: RRR with no murmurs appreciated Pulm: NWOB, CTAB with no crackles, wheezes, or rhonchi GI: Normal bowel sounds present. Soft, Nontender, Nondistended. MSK: no edema, cyanosis, or clubbing noted Skin: warm, dry Neuro: grossly normal, moves all extremities Psych: Normal affect and thought content  Pertinent Labs:   Assessment & Plan    Problem List Items Addressed This Visit    None     Health Maintenance Due  Topic Date Due  . INFLUENZA VACCINE  03/27/2019   Health Maintenance discussed with patient and patient agrees to address when able.   Follow-up: {plan; follow up:13214}

## 2019-06-17 ENCOUNTER — Other Ambulatory Visit: Payer: Self-pay

## 2019-06-17 DIAGNOSIS — Z20822 Contact with and (suspected) exposure to covid-19: Secondary | ICD-10-CM

## 2019-06-19 LAB — NOVEL CORONAVIRUS, NAA: SARS-CoV-2, NAA: NOT DETECTED

## 2019-09-23 ENCOUNTER — Ambulatory Visit (INDEPENDENT_AMBULATORY_CARE_PROVIDER_SITE_OTHER): Payer: BC Managed Care – PPO | Admitting: Family Medicine

## 2019-09-23 ENCOUNTER — Encounter: Payer: Self-pay | Admitting: Family Medicine

## 2019-09-23 ENCOUNTER — Other Ambulatory Visit: Payer: Self-pay

## 2019-09-23 ENCOUNTER — Other Ambulatory Visit (HOSPITAL_COMMUNITY)
Admission: RE | Admit: 2019-09-23 | Discharge: 2019-09-23 | Disposition: A | Discharge status: Home / Self Care | Payer: BC Managed Care – PPO | Source: Ambulatory Visit | Attending: Family Medicine | Admitting: Family Medicine

## 2019-09-23 ENCOUNTER — Ambulatory Visit: Payer: BC Managed Care – PPO

## 2019-09-23 VITALS — BP 120/84 | HR 94 | Wt 175.0 lb

## 2019-09-23 DIAGNOSIS — R300 Dysuria: Secondary | ICD-10-CM | POA: Diagnosis not present

## 2019-09-23 DIAGNOSIS — R1030 Lower abdominal pain, unspecified: Secondary | ICD-10-CM | POA: Diagnosis not present

## 2019-09-23 DIAGNOSIS — N898 Other specified noninflammatory disorders of vagina: Secondary | ICD-10-CM | POA: Diagnosis not present

## 2019-09-23 LAB — POCT URINALYSIS DIP (MANUAL ENTRY)
Bilirubin, UA: NEGATIVE
Glucose, UA: NEGATIVE mg/dL
Ketones, POC UA: NEGATIVE mg/dL
Leukocytes, UA: NEGATIVE
Nitrite, UA: NEGATIVE
Protein Ur, POC: NEGATIVE mg/dL
Spec Grav, UA: >=1.03 — AB (ref 1.010–1.025)
Urobilinogen, UA: 0.2 E.U./dL
pH, UA: 5.5 (ref 5.0–8.0)

## 2019-09-23 LAB — POCT WET PREP (WET MOUNT)
Clue Cells Wet Prep Whiff POC: NEGATIVE
Trichomonas Wet Prep HPF POC: ABSENT

## 2019-09-23 LAB — POCT UA - MICROSCOPIC ONLY

## 2019-09-23 NOTE — Assessment & Plan Note (Signed)
Negative wet prep.  I have discussed with her.  GC and Chlamydia cultures are pending and we will notify her.

## 2019-09-23 NOTE — Patient Instructions (Signed)
Your wet prep and dipstick analysis of your urine were normal.  We are sending your urine out for culture.  There was a very small amount of blood seen the urinalysis but not enough to be concerning.  Culture should come back by Monday as should your cervical cultures.  They will be available in MyChart.  I will send you note through my chart.  If you need treatment, we will call you.  If you have any worsening of symptoms or new symptoms, please let us know as soon as possible.  Great to meet you.

## 2019-09-23 NOTE — Progress Notes (Signed)
    CHIEF COMPLAINT / HPI: Some vaginal itching  (?)with mild discharge and 2 to 3 days of sensitivity when she wipes. Not sure if she is having actual burning on urination.No regular menses as she has IUD (due for replacement by her report in March) Monogamous mostly.G1P1 Also has occasional sharpish pelvic pain that is located in her fatty area in pelvis. Very intermittent. Lasts seconds. No associated with intercourse, not associated with bowel movement or urination. 4-5/10.   PERTINENT  PMH / PSH: I have reviewed the patient's medications, allergies, past medical and surgical history, smoking status and updated in the EMR as appropriate.   OBJECTIVE:  BP 120/84   Pulse 94   Wt 175 lb (79.4 kg)   BMI 39.23 kg/m   GENERAL: Well-developed overweight female no acute distress Abdomen: Soft, positive bowel sounds nontender nondistended GU externally is normal.  Bimanual is without any evidence of adnexal mass or unusual tenderness although this exam  is limited somewhat by habitus.  Cervix appears normal.  ASSESSMENT / PLAN:    Vaginal itching Negative wet prep.  I have discussed with her.  GC and Chlamydia cultures are pending and we will notify her.  Dysuria Very small amount of blood on her urinalysis but no leukocytes.  Will do culture.  Discussed with her.  We will contact her with remainder of her laboratory work-up.  She has several other issues she wants to talk about with her PCP so I recommend she follow-up with them.  Abdominal pain Intermittent, diffuse, ill-defined.  Could be related to ovarian cyst, could be related to musculoskeletal issues or could be related to bowel cramping.  Discussed.  Do not think there is anything acute going on.  I would have her follow-up with her PCP   Dorcas Mcmurray MD

## 2019-09-23 NOTE — Assessment & Plan Note (Signed)
Very small amount of blood on her urinalysis but no leukocytes.  Will do culture.  Discussed with her.  We will contact her with remainder of her laboratory work-up.  She has several other issues she wants to talk about with her PCP so I recommend she follow-up with them.

## 2019-09-23 NOTE — Assessment & Plan Note (Signed)
Intermittent, diffuse, ill-defined.  Could be related to ovarian cyst, could be related to musculoskeletal issues or could be related to bowel cramping.  Discussed.  Do not think there is anything acute going on.  I would have her follow-up with her PCP

## 2019-09-24 LAB — CERVICOVAGINAL ANCILLARY ONLY
Chlamydia: NEGATIVE
Comment: NEGATIVE
Comment: NORMAL
Neisseria Gonorrhea: NEGATIVE

## 2019-09-25 LAB — URINE CULTURE

## 2019-09-30 ENCOUNTER — Ambulatory Visit: Payer: BC Managed Care – PPO

## 2019-09-30 ENCOUNTER — Other Ambulatory Visit: Payer: Self-pay

## 2019-09-30 ENCOUNTER — Ambulatory Visit (INDEPENDENT_AMBULATORY_CARE_PROVIDER_SITE_OTHER): Payer: BC Managed Care – PPO | Admitting: Family Medicine

## 2019-09-30 ENCOUNTER — Encounter: Payer: Self-pay | Admitting: Family Medicine

## 2019-09-30 VITALS — BP 117/70 | HR 71 | Wt 176.4 lb

## 2019-09-30 DIAGNOSIS — N898 Other specified noninflammatory disorders of vagina: Secondary | ICD-10-CM

## 2019-09-30 LAB — POCT WET PREP (WET MOUNT)
Clue Cells Wet Prep Whiff POC: NEGATIVE
Trichomonas Wet Prep HPF POC: ABSENT

## 2019-09-30 MED ORDER — FLUCONAZOLE 150 MG PO TABS: 150.0000 mg | ORAL_TABLET | Freq: Once | ORAL | 1 refills | Status: AC

## 2019-09-30 NOTE — Progress Notes (Signed)
    Subjective:  [name] is a 44 y.o. female who presents to the [facility] today with a chief complaint of follow up for vaginal itching.   HPI:   [name] here for follow up for vaginal itching.    Patient reports vaginal discharge and itching since 09/22/2019.  Was seen in the clinic on 09/23/2019 had a negative work-up.  Patient still complains of vaginal itching but states the itching is on the external surface of her vagina.  She does note white chalky-like discharge but does not have an odor.  Unable to qualify consistency or quantity.  Reports it is much like her normal vaginal discharge.  Denies vaginal bleeding.  Denies dysuria, hematuria, dyspareunia and abdominal pain.  Endorses increased pain after urinating.  States she washes with dilute white antibacterial soap.  She is not concerned for STDs.    ROS: See HPI        Objective:  Physical Exam: BP 117/70   Pulse 71   Wt 176 lb 6.4 oz (80 kg)   SpO2 100%   BMI 39.55 kg/m    GEN: well appearing female in no acute distress  RESP: speaking in full sentences without pause GU: Pelvic exam: VULVA: vulva with no masse, diffuse excoriations VAGINA: normal appearing vagina with normal color and discharge, no lesions, vaginal discharge - white and curd-like, CERVIX: cervical discharge present - white and curd-like, WET MOUNT done - results below exam chaperoned by CMA. ABD: soft, non tender, active bowel sounds, non distended, no palpable masses   Results for orders placed or performed in visit on 09/30/19 (from the past 72 hour(s))  POCT Wet Prep [facility])     Status: Abnormal   Collection Time: 09/30/19  9:25 AM  Result Value Ref Range   Source Wet Prep POC VAG    WBC, Wet Prep HPF POC 0-3    Bacteria Wet Prep HPF POC Few Few   Clue Cells Wet Prep HPF POC None None   Clue Cells Wet Prep Whiff POC Negative Whiff    Yeast Wet Prep HPF POC Few (A) None   KOH Wet Prep POC Few (A) None   Trichomonas Wet Prep HPF  POC Absent Absent     Assessment/Plan:  Vaginal itching DDx: GC/CT, BV, yeast, trichomoniasis , irritants / allergens, physiologic leukorrhea. Patient with known hx of vaginitis.  Patient with no new recent partners and negative GC/CT testing last week.  Patient uses Dial white antibacterial bar soap. Wet prep indicated yeast.   Possibly antibacterial soap had disrupted her vaginal flora. Will treat with oral Fluconazole.  Advised to stop using antibacterial soap to wash vulva/vagina.  Follow up if symptoms worsen or do not improve with treatment.       Orders Placed This Encounter  Procedures  . POCT Wet Prep [facility])    Meds ordered this encounter  Medications  . fluconazole (DIFLUCAN) 150 MG tablet    Sig: Take 1 tablet (150 mg total) by mouth once for 1 dose.    Dispense:  1 tablet    Refill:  [place], DO PGY-1, [facility]  09/30/2019 9:45 AM

## 2019-09-30 NOTE — Assessment & Plan Note (Addendum)
DDx: GC/CT, BV, yeast, trichomoniasis , irritants / allergens, physiologic leukorrhea. Patient with known hx of vaginitis.  Patient with no new recent partners and negative GC/CT testing last week.  Patient uses Dial white antibacterial bar soap. Wet prep indicated yeast.   Possibly antibacterial soap had disrupted her vaginal flora. Will treat with oral Fluconazole.  Advised to stop using antibacterial soap to wash vulva/vagina.  Follow up if symptoms worsen or do not improve with treatment.

## 2019-09-30 NOTE — Progress Notes (Deleted)
   CHIEF COMPLAINT / HPI: vaginal itching and discharge  VAGINAL DISCHARGE - seen previously 1/28 for same with negative wet prep and GC/CT.  Having vaginal discharge for *** days. Discharge consistency: *** Discharge color: *** Medications tried: ***  Recent antibiotic use: *** Sex in last month: *** Possible STD exposure:***  Symptoms Fever: *** Dysuria:*** Vaginal bleeding: *** Abdomen or Pelvic pain: *** Back pain: *** Genital sores or ulcers:*** Rash: *** Pain during sex: *** Missed menstrual period: ***IUD, due for replacement 10/2019.  ROS see HPI Smoking Status noted    PERTINENT  PMH / PSH: ***   OBJECTIVE: There were no vitals taken for this visit.  ***  ASSESSMENT / PLAN:  No problem-specific Assessment & Plan notes found for this encounter.     Rory Percy, [place]

## 2019-09-30 NOTE — Patient Instructions (Signed)
It was great seeing you today!   I'd like to see you back for any new issues we're happy to fit you in, just give [place] a call!  - Pick up your prescription at the pharmacy  - As discussed, switch to a gentle non-antibacterial soap.    If you have questions or concerns please do not hesitate to call at [phone].  Dr. Rushie [facility]

## 2019-10-08 ENCOUNTER — Encounter: Payer: Self-pay | Admitting: Family Medicine

## 2019-10-08 ENCOUNTER — Ambulatory Visit (INDEPENDENT_AMBULATORY_CARE_PROVIDER_SITE_OTHER): Payer: BC Managed Care – PPO | Admitting: Family Medicine

## 2019-10-08 ENCOUNTER — Other Ambulatory Visit: Payer: Self-pay

## 2019-10-08 VITALS — BP 120/70 | HR 59 | Wt 173.8 lb

## 2019-10-08 DIAGNOSIS — R5383 Other fatigue: Secondary | ICD-10-CM | POA: Diagnosis not present

## 2019-10-08 DIAGNOSIS — M25551 Pain in right hip: Secondary | ICD-10-CM

## 2019-10-08 NOTE — Progress Notes (Signed)
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  This has been experiencoing this for a few months, feels tired thrghout the day.  Goes to bed at 10 sometimes 11.  Doesn't drink coffee after 9am.  No sodas.  Doesn't drink tea all the time.  Falls asleep well.  Stays asleep, doesn't awake to her knowledge.  Has a TV in her room, but doesn't use it at night.  Not always on phone before going to sleep.  When she waks up, feels well-rested, then gets tired after getting ready.  Occasional "sinus headaches"  Has had them twice in the last month or so.  A little less frequent that usual.  Doesn't smoke.  No family history of thyroid disorder.  Other than feeling tired, she feels okay.  Eats two meals a day, no snacking.  Eats "whatever I want."  Eats out all the time.  She doesn't cook.  Not drinking very much water during the day.  Has not been exercising much since it got cold.  Does not nap during the day.    Right Hip Pain When she gets up to walk, it feels stiff and sore, then "it works itself out."  Has been going on a few months.  Comes and goes.  Pain is on the side of of her hip.  Doesn't radiate anywhere.  Doesn't hurt to touch.  Can feel the pull.  Has tried ibuprofen or tylenol but not constant so doesn't use.  Has not tried ice.  Tries some strethcing.  Can sleep on her side without a problem.  Didn't bother her when seh was exercising. Not a severe pain.  Feels like everytime she gets up she has this problem.   PERTINENT  PMH / PSH: Obesity   OBJECTIVE: BP 120/70   Pulse (!) 59   Wt 173 lb 12.8 oz (78.8 kg)   SpO2 100%   BMI 38.97 kg/m    Physical Exam:  General: 44 y.o. female in NAD Neck: No thyromegaly Cardio: RRR no m/r/g Lungs: CTAB, no wheezing, no rhonchi, no crackles, no IWOB on RA Skin: warm and dry Right hip:  - Inspection: No gross deformity, no swelling, erythema, or ecchymosis  bilaterally. - Palpation: Mild tenderness to palpation along right IT band, no tenderness to palpation along greater trochanter specifically on right.  No tenderness to palpation on left. - ROM: Normal range of motion on Flexion, extension, abduction, internal and external rotation bilaterally.  Some pain in hip joint with internal rotation of right hip. - Strength: Normal strength aside from 4/5 strength in hip abductors on right. - Neuro/vasc: NV intact distally bilaterally. - Special Tests: Positive FADIR on right.  Negative FABER bilaterally and FADIR on left.  Negative piriformis stretch on right.  Negative logroll on right.  Positive Ober's on right.    ASSESSMENT / PLAN:  Fatigue Does not note not feeling rested upon waking or headaches frequently, therefore doubt sleep apnea.  Could be a possibility in the future should other things not reveal a cause.  Could also assess depression at next visit.  Discussed with patient obtaining labs, which she would like to do.  Also discussed at length lifestyle changes including increasing water intake, eating healthy foods, reducing takeout, and exercising at least 30 minutes a day.  She seems to have good sleep hygiene per her report.  Will obtain BMP,  CBC, TSH, vitamin D to further assess.  Could consider sleep study in the future if indicated.  Right hip pain Patient has tight IT band on right.  Discussed with her stretching exercises and using roller.  Also discussed that she could have osteoarthritis given that she has stiffness that improves with walking and has pain with severe in internal rotation.  Offered options for treatment including physical therapy, intra-articular injection.  She does not want to proceed with either of these at this time.  Discussed with patient that x-ray would likely not be indicated if she is not looking to proceed with intra-articular injection or surgery at this time.  She is reported that she would not like to have  an x-ray.  At this time will work on diet and exercise as well as IT band stretches.  Advised to follow-up if no improvement.     [name], [place]

## 2019-10-08 NOTE — Patient Instructions (Addendum)
Thank you for coming to see me today. It was a pleasure. Today we talked about:   Focus on your diet and exercise.  Eat good, healthy things to give your body energy.  Focus on drinking water.  We will get labs.  Please follow-up with me in 3 months.  If you have any questions or concerns, please do not hesitate to call the office at [phone].  Best,   [place] Constable, DO   Iliotibial Band Syndrome Rehab Ask your health care provider which exercises are safe for you. Do exercises exactly as told by your health care provider and adjust them as directed. It is normal to feel mild stretching, pulling, tightness, or discomfort as you do these exercises. Stop right away if you feel sudden pain or your pain gets significantly worse. Do not begin these exercises until told by your health care provider. Stretching and range-of-motion exercises These exercises warm up your muscles and joints and improve the movement and flexibility of your hip and pelvis. Quadriceps stretch, prone  1. Lie on your abdomen on a firm surface, such as a bed or padded floor (prone position). 2. Bend your left / right knee and reach back to hold your ankle or pant leg. If you cannot reach your ankle or pant leg, loop a belt around your foot and grab the belt instead. 3. Gently pull your heel toward your buttocks. Your knee should not slide out to the side. You should feel a stretch in the front of your thigh and knee (quadriceps). 4. Hold this position for ___10_______ seconds. Repeat ____3______ times. Complete this exercise ____3______ times a day. Iliotibial band stretch An iliotibial band is a strong band of muscle tissue that runs from the outer side of your hip to the outer side of your thigh and knee. 1. Lie on your side with your left / right leg in the top position. 2. Bend both of your knees and grab your left / right ankle. Stretch out your bottom arm to help you balance. 3. Slowly bring your top  knee back so your thigh goes behind your trunk. 4. Slowly lower your top leg toward the floor until you feel a gentle stretch on the outside of your left / right hip and thigh. If you do not feel a stretch and your knee will not fall farther, place the heel of your other foot on top of your knee and pull your knee down toward the floor with your foot. 5. Hold this position for __________ seconds. Repeat __________ times. Complete this exercise __________ times a day. Strengthening exercises These exercises build strength and endurance in your hip and pelvis. Endurance is the ability to use your muscles for a long time, even after they get tired. Straight leg raises, side-lying This exercise strengthens the muscles that rotate the leg at the hip and move it away from your body (hip abductors). 1. Lie on your side with your left / right leg in the top position. Lie so your head, shoulder, hip, and knee line up. You may bend your bottom knee to help you balance. 2. Roll your hips slightly forward so your hips are stacked directly over each other and your left / right knee is facing forward. 3. Tense the muscles in your outer thigh and lift your top leg 4-6 inches (10-15 cm). 4. Hold this position for __________ seconds. 5. Slowly return to the starting position. Let your muscles relax completely before doing another repetition. Repeat  __________ times. Complete this exercise __________ times a day. Leg raises, prone This exercise strengthens the muscles that move the hips (hip extensors). 1. Lie on your abdomen on your bed or a firm surface. You can put a pillow under your hips if that is more comfortable for your lower back. 2. Bend your left / right knee so your foot is straight up in the air. 3. Squeeze your buttocks muscles and lift your left / right thigh off the bed. Do not let your back arch. 4. Tense your thigh muscle as hard as you can without increasing any knee pain. 5. Hold this position  for __________ seconds. 6. Slowly lower your leg to the starting position and allow it to relax completely. Repeat __________ times. Complete this exercise __________ times a day. Hip hike 1. Stand sideways on a bottom step. Stand on your left / right leg with your other foot unsupported next to the step. You can hold on to the railing or wall for balance if needed. 2. Keep your knees straight and your torso square. Then lift your left / right hip up toward the ceiling. 3. Slowly let your left / right hip lower toward the floor, past the starting position. Your foot should get closer to the floor. Do not lean or bend your knees. Repeat __________ times. Complete this exercise __________ times a day. This information is not intended to replace advice given to you by your health care provider. Make sure you discuss any questions you have with your health care provider. Document Revised: 12/03/2018 Document Reviewed: 06/03/2018 Elsevier Patient Education  [employer].

## 2019-10-09 LAB — BASIC METABOLIC PANEL
BUN/Creatinine Ratio: 10 (ref 9–23)
BUN: 9 mg/dL (ref 6–24)
CO2: 26 mmol/L (ref 20–29)
Calcium: 9.7 mg/dL (ref 8.7–10.2)
Chloride: 101 mmol/L (ref 96–106)
Creatinine, Ser: 0.89 mg/dL (ref 0.57–1.00)
GFR calc Af Amer: 92 mL/min/{1.73_m2} (ref >59)
GFR calc non Af Amer: 80 mL/min/{1.73_m2} (ref >59)
Glucose: 91 mg/dL (ref 65–99)
Potassium: 4.2 mmol/L (ref 3.5–5.2)
Sodium: 140 mmol/L (ref 134–144)

## 2019-10-09 LAB — CBC
Hematocrit: 40.4 % (ref 34.0–46.6)
Hemoglobin: 13 g/dL (ref 11.1–15.9)
MCH: 28.1 pg (ref 26.6–33.0)
MCHC: 32.2 g/dL (ref 31.5–35.7)
MCV: 87 fL (ref 79–97)
Platelets: 387 10*3/uL (ref 150–450)
RBC: 4.63 x10E6/uL (ref 3.77–5.28)
RDW: 12.5 % (ref 11.7–15.4)
WBC: 6.8 10*3/uL (ref 3.4–10.8)

## 2019-10-09 LAB — TSH: TSH: 0.642 u[IU]/mL (ref 0.450–4.500)

## 2019-10-09 LAB — VITAMIN D 25 HYDROXY (VIT D DEFICIENCY, FRACTURES): Vit D, 25-Hydroxy: 26.6 ng/mL — ABNORMAL LOW (ref 30.0–100.0)

## 2019-10-10 DIAGNOSIS — R5383 Other fatigue: Secondary | ICD-10-CM | POA: Insufficient documentation

## 2019-10-10 DIAGNOSIS — M25551 Pain in right hip: Secondary | ICD-10-CM | POA: Insufficient documentation

## 2019-10-10 NOTE — Assessment & Plan Note (Signed)
Does not note not feeling rested upon waking or headaches frequently, therefore doubt sleep apnea.  Could be a possibility in the future should other things not reveal a cause.  Could also assess depression at next visit.  Discussed with patient obtaining labs, which she would like to do.  Also discussed at length lifestyle changes including increasing water intake, eating healthy foods, reducing takeout, and exercising at least 30 minutes a day.  She seems to have good sleep hygiene per her report.  Will obtain BMP, CBC, TSH, vitamin D to further assess.  Could consider sleep study in the future if indicated.

## 2019-10-10 NOTE — Assessment & Plan Note (Signed)
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  Discussed with her stretching exercises and using roller.  Also discussed that she could have osteoarthritis given that she has stiffness that improves with walking and has pain with severe in internal rotation.  Offered options for treatment including physical therapy, intra-articular injection.  She does not want to proceed with either of these at this time.  Discussed with patient that x-ray would likely not be indicated if she is not looking to proceed with intra-articular injection or surgery at this time.  She is reported that she would not like to have an x-ray.  At this time will work on diet and exercise as well as IT band stretches.  Advised to follow-up if no improvement.

## 2019-10-11 ENCOUNTER — Encounter: Payer: Self-pay | Admitting: Family Medicine

## 2019-11-19 ENCOUNTER — Other Ambulatory Visit: Payer: Self-pay | Admitting: Family Medicine

## 2019-11-19 ENCOUNTER — Encounter: Payer: Self-pay | Admitting: Family Medicine

## 2019-11-19 DIAGNOSIS — E559 Vitamin D deficiency, unspecified: Secondary | ICD-10-CM | POA: Insufficient documentation

## 2019-11-19 NOTE — Progress Notes (Signed)
Created in error

## 2019-11-29 ENCOUNTER — Other Ambulatory Visit: Payer: Self-pay

## 2019-11-29 ENCOUNTER — Ambulatory Visit (INDEPENDENT_AMBULATORY_CARE_PROVIDER_SITE_OTHER): Payer: BC Managed Care – PPO | Admitting: Family Medicine

## 2019-11-29 ENCOUNTER — Other Ambulatory Visit (HOSPITAL_COMMUNITY)
Admission: RE | Admit: 2019-11-29 | Discharge: 2019-11-29 | Disposition: A | Discharge status: Home / Self Care | Payer: BC Managed Care – PPO | Source: Ambulatory Visit | Attending: Family Medicine | Admitting: Family Medicine

## 2019-11-29 VITALS — BP 118/75 | HR 76 | Ht 59.09 in | Wt 173.0 lb

## 2019-11-29 DIAGNOSIS — Z1151 Encounter for screening for human papillomavirus (HPV): Secondary | ICD-10-CM | POA: Diagnosis not present

## 2019-11-29 DIAGNOSIS — Z202 Contact with and (suspected) exposure to infections with a predominantly sexual mode of transmission: Secondary | ICD-10-CM | POA: Diagnosis not present

## 2019-11-29 DIAGNOSIS — R87610 Atypical squamous cells of undetermined significance on cytologic smear of cervix (ASC-US): Secondary | ICD-10-CM

## 2019-11-29 DIAGNOSIS — N898 Other specified noninflammatory disorders of vagina: Secondary | ICD-10-CM | POA: Diagnosis not present

## 2019-11-29 LAB — POCT WET PREP (WET MOUNT)
Clue Cells Wet Prep Whiff POC: POSITIVE
Trichomonas Wet Prep HPF POC: ABSENT

## 2019-11-29 MED ORDER — METRONIDAZOLE 500 MG PO TABS: 500.0000 mg | ORAL_TABLET | Freq: Two times a day (BID) | ORAL | 0 refills | Status: AC

## 2019-11-29 NOTE — Assessment & Plan Note (Signed)
Repeat pap obtained today

## 2019-11-29 NOTE — Assessment & Plan Note (Addendum)
Patient with symptoms of vaginal discharge. Wet prep consistent with BV. RX for metronidazole sent. Mychart message sent with results and to inform her RX was sent. Advised to avoid alcohol use with metronidazole. GC/chlamydia pending. Patient refused blood STD testing

## 2019-11-29 NOTE — Patient Instructions (Addendum)
1. I will call or send a mychart message with results 2. We will send in medications if needed 3. If no improvement please follow up 4. Use condoms to prevent STD exposure  5. Schedule an appointment with colposcopy clinic to have IUD removed and replaced  Dr. Tammi Klippel

## 2019-11-29 NOTE — Progress Notes (Signed)
    SUBJECTIVE:   CHIEF COMPLAINT / HPI:   VAGINAL DISCHARGE  Onset: 4 days    Description: dark brown, thick   Odor: fishy    Itching: none   Symptoms Dysuria: no  Bleeding: no  Pelvic pain: no  Back pain: no  Fever: no  Genital sores: no  Rash: no  Dyspareunia: no  GI Sxs: no  Prior treatment: no   Red Flags: Missed period: no  Pregnancy: no  Recent antibiotics: no  Sexual activity: yes, "kind of new" partner   Possible STD exposure: no  IUD: yes  Diabetes: no   Pap Patient is due for pap smear. Reports no h/o abnormal paps but per chart review as problem of ASCUS pap under her problem list. Last Pap from 2016 showing negative.  HPV not detected.  Patient is sexually active with one partner.  Does not have regular menses as she has an IUD.  PERTINENT  PMH / PSH: ASCUS pap, obesity   OBJECTIVE:   BP 118/75   Pulse 76   Ht 4' 11.09" (1.501 m)   Wt 173 lb (78.5 kg)   SpO2 97%   BMI 34.83 kg/m   Gen: awake and alert, NAD Female genitalia: normal external genitalia, vulva, vagina, cervix, uterus and adnexa. Chaperone present   ASSESSMENT/PLAN:   Vaginal discharge Patient with symptoms of vaginal discharge. Wet prep consistent with BV. RX for metronidazole sent. Mychart message sent with results and to inform her RX was sent. Advised to avoid alcohol use with metronidazole. GC/chlamydia pending. Patient refused blood STD testing  ASCUS PAP Repeat pap obtained today      Caroline More, [place]

## 2019-11-30 LAB — CYTOLOGY - PAP
Chlamydia: NEGATIVE
Comment: NEGATIVE
Comment: NEGATIVE
Comment: NORMAL
Diagnosis: NEGATIVE
High risk HPV: NEGATIVE
Neisseria Gonorrhea: NEGATIVE

## 2019-11-30 NOTE — Addendum Note (Signed)
Addended by: Maryland Pink on: 11/30/2019 09:06 AM   Modules accepted: Orders

## 2019-12-04 ENCOUNTER — Ambulatory Visit: Payer: BC Managed Care – PPO | Attending: Internal Medicine

## 2019-12-04 DIAGNOSIS — Z23 Encounter for immunization: Secondary | ICD-10-CM

## 2019-12-04 NOTE — Progress Notes (Signed)
   Covid-19 Vaccination Clinic  Name:  [name]    MRN: [id number] DOB: 02-Nov-1975  12/04/2019  [name] was observed post Covid-19 immunization for 15 minutes without incident. She was provided with Vaccine Information Sheet and instruction to access the V-Safe system.   [name] was instructed to call 911 with any severe reactions post vaccine: Marland Kitchen Difficulty breathing  . Swelling of face and throat  . A fast heartbeat  . A bad rash all over body  . Dizziness and weakness   Immunizations Administered    Name Date Dose VIS Date Route   Moderna COVID-19 Vaccine 12/04/2019  2:50 PM 0.5 mL 07/27/2019 Intramuscular   Manufacturer: Moderna   LotFP:[id number]   [place]:[id number]

## 2020-01-01 ENCOUNTER — Ambulatory Visit: Payer: BC Managed Care – PPO | Attending: Internal Medicine

## 2020-01-01 DIAGNOSIS — Z23 Encounter for immunization: Secondary | ICD-10-CM

## 2020-01-01 NOTE — Progress Notes (Signed)
   Covid-19 Vaccination Clinic  Name:  [name]    MRN: [id number] DOB: 1976-05-07  01/01/2020  [name] was observed post Covid-19 immunization for 15 minutes without incident. She was provided with Vaccine Information Sheet and instruction to access the V-Safe system.   [name] was instructed to call 911 with any severe reactions post vaccine: Marland Kitchen Difficulty breathing  . Swelling of face and throat  . A fast heartbeat  . A bad rash all over body  . Dizziness and weakness   Immunizations Administered    Name Date Dose VIS Date Route   Moderna COVID-19 Vaccine 01/01/2020  9:50 AM 0.5 mL 07/2019 Intramuscular   Manufacturer: Moderna   Lot: [id number]   [place]:[id number]

## 2020-01-10 NOTE — Progress Notes (Signed)
    SUBJECTIVE:   CHIEF COMPLAINT / HPI:   VAGINAL DISCHARGE  Onset: over the weekend    Description: thick, white   Odor: none    Itching: yes   Symptoms Dysuria: no  Bleeding: no  Pelvic pain: no  Back pain: no  Fever: no  Genital sores: no  Rash: no  Dyspareunia: no  GI Sxs: no  Prior treatment: no   Red Flags: Missed period: no  Pregnancy: no  Recent antibiotics: no  Sexual activity: yes, same partner.   Possible STD exposure: no  IUD: yes  Diabetes: no    PERTINENT  PMH / PSH: vaginal discharge, obesity  OBJECTIVE:   BP 108/72   Pulse 75   Temp 98.2 F (36.8 C) (Oral)   Wt 170 lb (77.1 kg)   BMI 34.23 kg/m   Female genitalia: normal external genitalia, vulva, vagina, cervix, uterus and adnexa Chaperone present  ASSESSMENT/PLAN:   Vaginal discharge Patient with vaginal discharge.  Wet prep was negative.  Given negative findings will not treat at this time.  Follow-up if no improvement.  Advised STD testing but patient refusing at this time.     Caroline More, [place]

## 2020-01-11 ENCOUNTER — Ambulatory Visit (INDEPENDENT_AMBULATORY_CARE_PROVIDER_SITE_OTHER): Payer: BC Managed Care – PPO | Admitting: Family Medicine

## 2020-01-11 ENCOUNTER — Other Ambulatory Visit: Payer: Self-pay

## 2020-01-11 ENCOUNTER — Encounter: Payer: Self-pay | Admitting: Family Medicine

## 2020-01-11 VITALS — BP 108/72 | HR 75 | Temp 98.2°F | Wt 170.0 lb

## 2020-01-11 DIAGNOSIS — N898 Other specified noninflammatory disorders of vagina: Secondary | ICD-10-CM | POA: Diagnosis not present

## 2020-01-11 LAB — POCT WET PREP (WET MOUNT)
Clue Cells Wet Prep Whiff POC: NEGATIVE
Trichomonas Wet Prep HPF POC: ABSENT

## 2020-01-11 NOTE — Patient Instructions (Signed)
1. Your wet prep was negative 2. If you symptoms do not improve in 2 weeks please follow up

## 2020-01-11 NOTE — Assessment & Plan Note (Addendum)
Patient with vaginal discharge.  Wet prep was negative.  Given negative findings will not treat at this time.  Follow-up if no improvement.  Advised STD testing but patient refusing at this time.

## 2020-03-09 ENCOUNTER — Ambulatory Visit: Payer: BC Managed Care – PPO | Admitting: Family Medicine

## 2020-03-14 NOTE — Progress Notes (Signed)
    SUBJECTIVE:   CHIEF COMPLAINT / HPI: IUD removal and insertion  Vaginal discharge or bleeding. Denies any dysuria.    PERTINENT  PMH / PSH:  Mirena inserted 2015   OBJECTIVE:   BP 102/60   Pulse 70   Wt 167 lb (75.8 kg)   SpO2 98%   BMI 33.62 kg/m    SVE: IUD strings not visible, nulliparous cervical os, normal vaginal mucosa  ASSESSMENT/PLAN:   Contraception management Unable to visualize IUD strings. Bedside ultrasound shows IUD in place. -Refer OBGYN for evaluation of removal and IUD reinsertion.     Carollee Leitz, MD [place]

## 2020-03-14 NOTE — Patient Instructions (Signed)
Thank you for coming to see me today. It was a pleasure.   I was unable to find the IUD strings today.  I will refer you you OBGYN for removal and reinsertion of IUD  Please follow-up with PCP in 4 weeks   If you have any questions or concerns, please do not hesitate to call the office at [phone].  Best,  Carollee Leitz, MD

## 2020-03-16 ENCOUNTER — Other Ambulatory Visit: Payer: Self-pay

## 2020-03-16 ENCOUNTER — Ambulatory Visit (INDEPENDENT_AMBULATORY_CARE_PROVIDER_SITE_OTHER): Payer: BC Managed Care – PPO | Admitting: Family Medicine

## 2020-03-16 VITALS — BP 102/60 | HR 70 | Wt 167.0 lb

## 2020-03-16 DIAGNOSIS — T839XXA Unspecified complication of genitourinary prosthetic device, implant and graft, initial encounter: Secondary | ICD-10-CM

## 2020-03-16 DIAGNOSIS — Z3049 Encounter for surveillance of other contraceptives: Secondary | ICD-10-CM

## 2020-03-16 NOTE — Assessment & Plan Note (Signed)
Unable to visualize IUD strings. Bedside ultrasound shows IUD in place. -Refer OBGYN for evaluation of removal and IUD reinsertion.

## 2020-03-21 ENCOUNTER — Other Ambulatory Visit: Payer: Self-pay

## 2020-03-21 ENCOUNTER — Encounter: Payer: Self-pay | Admitting: Family Medicine

## 2020-03-21 ENCOUNTER — Ambulatory Visit (INDEPENDENT_AMBULATORY_CARE_PROVIDER_SITE_OTHER): Payer: BC Managed Care – PPO | Admitting: Family Medicine

## 2020-03-21 VITALS — BP 124/68 | HR 68 | Wt 167.0 lb

## 2020-03-21 DIAGNOSIS — N760 Acute vaginitis: Secondary | ICD-10-CM | POA: Diagnosis not present

## 2020-03-21 LAB — POCT WET PREP (WET MOUNT)
Clue Cells Wet Prep Whiff POC: POSITIVE
Trichomonas Wet Prep HPF POC: ABSENT

## 2020-03-21 MED ORDER — METRONIDAZOLE 500 MG PO TABS
500.0000 mg | ORAL_TABLET | Freq: Two times a day (BID) | ORAL | 0 refills | Status: AC
Start: 2020-03-21 — End: 2020-03-28

## 2020-03-21 NOTE — Progress Notes (Signed)
    SUBJECTIVE:   CHIEF COMPLAINT / HPI:   Vaginal Discharge The patient's primary symptoms include genital itching and vaginal discharge. The patient's pertinent negatives include no genital odor or vaginal bleeding. This is a new problem. Episode onset: Started 5 days ago. The problem occurs constantly. The problem has been unchanged. Pertinent negatives include no abdominal pain. The vaginal discharge was milky. There has been no bleeding. Nothing aggravates the symptoms. She is sexually active. No, her partner does not have an STD. She uses an IUD for contraception.     PERTINENT  PMH / PSH: PMX reviewed  OBJECTIVE:   BP 124/68   Pulse 68   Wt 167 lb (75.8 kg)   SpO2 99%   BMI 33.62 kg/m   Physical Exam Vitals reviewed. Exam conducted with a chaperone present Cherrie Distance Legette).  Cardiovascular:     Rate and Rhythm: Normal rate and regular rhythm.     Pulses: Normal pulses.     Heart sounds: Normal heart sounds. No murmur heard.   Pulmonary:     Effort: Pulmonary effort is normal. No respiratory distress.     Breath sounds: Normal breath sounds. No wheezing or rhonchi.  Abdominal:     General: Abdomen is flat. Bowel sounds are normal. There is no distension.     Palpations: There is no mass.     Tenderness: There is no abdominal tenderness.  Genitourinary:    Labia:        Right: No lesion.        Left: No lesion.      Vagina: Vaginal discharge present.     Cervix: Discharge present. No cervical motion tenderness, friability or erythema.     Uterus: Normal.      Adnexa: Right adnexa normal.      ASSESSMENT/PLAN:   Vaginitis: bacterial vaginosis Wet prep + clue cells I called her and discussed the report with her after visit. Metronidazole 500 mg BID recommended x 7 days. She will return soon if there is no improvement. She agreed with the plan.  More than 50% of this 45 minutes visit was spent on procedure, evaluation and coordination of care including post  visit telephone call.   Andrena Mews, MD [place]

## 2020-03-21 NOTE — Patient Instructions (Signed)

## 2020-05-03 ENCOUNTER — Encounter: Payer: BC Managed Care – PPO | Admitting: Obstetrics and Gynecology

## 2020-05-24 ENCOUNTER — Other Ambulatory Visit: Payer: Self-pay

## 2020-05-24 ENCOUNTER — Ambulatory Visit (INDEPENDENT_AMBULATORY_CARE_PROVIDER_SITE_OTHER): Payer: BC Managed Care – PPO | Admitting: Obstetrics & Gynecology

## 2020-05-24 ENCOUNTER — Encounter: Payer: Self-pay | Admitting: Obstetrics & Gynecology

## 2020-05-24 VITALS — BP 122/76 | HR 85 | Ht <= 58 in | Wt 168.0 lb

## 2020-05-24 DIAGNOSIS — Z30431 Encounter for routine checking of intrauterine contraceptive device: Secondary | ICD-10-CM

## 2020-05-24 DIAGNOSIS — T8339XA Other mechanical complication of intrauterine contraceptive device, initial encounter: Secondary | ICD-10-CM

## 2020-05-24 NOTE — Patient Instructions (Signed)
[place] [facility]  Department of Social [place] [address], [place], [place] [address] [phone]   or  www.[place]human-services/social-services **SNAP/EBT/ Other nutritional benefits  [place] DHHS-Public Health-[facility] [address], [place], [place] [address] [phone]  or  https://[place].gov[place]/human-services/health-department **WIC for  women who are pregnant and postpartum, infants and children up to 5 years old  [facility] [address], [place], [place] [address] [phone]   or   www.theblessedtable.org  **Food pantry  [facility] [address], [place], [place] [address] [phone]   or   https://[facility].godaddysites.com  **Emergency food and prepared meals  [facility] [address], [place], [place] [address] [phone]   or   www.cedargrovetop.us **Food pantry  [facility] [address], [place], [place] [address] [phone]   or   www.facebook.com/[facility]-116430931718202 **Food pantry  [facility] [address], [place], [place] [address] [phone] **Food pantry  [facility] [address], [place], [place] [address] [phone]   or   www.greensborourbanministry.org  **Food pantry and prepared meals  [facility] [address], [place], [place] [address] https://jfsgreensboro.org/  **Food pantry  [facility] [address], [place], [place] [address] [phone]   or   www.lbcnow.org  **Food pantry  [facility] [address], [place], [place] [address] [phone]   or   http://www.onestepfurther.com **Food pantry, nutrition education, gardening activities  [facility] [address], [place], [place] [address] [phone] **Food pantry  [facility] [place] [facility] [address], [place], [place] [address] [phone]   or   www.[facility]vationarmy[facility].org **Food pantry  [facility] [place] [address], [place], [place] [address] [phone]   or   http://senior-resources[place].org **Meals on Wheels Program  [facility] [address], [place], [place] [address] [phone]   or   www.stmattchurch.com  **Food pantry  [facility] [address], [place],  [address] [phone]   or   [facility].org **Food pantry     

## 2020-05-24 NOTE — Progress Notes (Signed)
[name] is an 44 y.o. female. Discussed IUD. Pt went to clinic for IUD removal, IUD strings were not visible. U/S confirmed IUD placement.  Based on new recommendations, expiration is April 2022.   Pertinent Gynecological History: Menses: no bleeding Bleeding: none since IUD Contraception: IUD  Menstrual History: Menarche age: 26 No LMP recorded. (Menstrual status: IUD).    No past medical history on file.  Past Surgical History:  Procedure Laterality Date  . CESAREAN SECTION    . CESAREAN SECTION      Family History  Problem Relation Age of Onset  . Hypertension Mother   . Diabetes Mother   . CAD Mother     Social History:  reports that she has never smoked. She has never used smokeless tobacco. She reports current alcohol use. She reports that she does not use drugs.  Allergies: No Known Allergies  (Not in a hospital admission)   Review of Systems  Constitutional: Negative for activity change, appetite change, fatigue and unexpected weight change.  HENT: Negative.   Eyes: Negative.   Respiratory: Negative.  Negative for chest tightness, shortness of breath and wheezing.   Cardiovascular: Negative.  Negative for chest pain and leg swelling.  Gastrointestinal: Negative.  Negative for abdominal distention, abdominal pain, constipation, diarrhea, nausea and vomiting.  Endocrine: Negative.   Genitourinary: Negative.  Negative for dysuria and hematuria.  Neurological: Negative.  Negative for dizziness, weakness, light-headedness, numbness and headaches.  Hematological: Negative.   Psychiatric/Behavioral: Negative.  Negative for agitation, confusion and decreased concentration.    Blood pressure 122/76, pulse 85, height 4\' 8"  (1.422 m), weight 168 lb (76.2 kg). Physical Exam Vitals reviewed.  Constitutional:      Appearance: She is well-developed.  HENT:     Head: Normocephalic and atraumatic.  Eyes:     Pupils: Pupils are equal, round, and reactive to  light.  Cardiovascular:     Rate and Rhythm: Normal rate.  Pulmonary:     Effort: Pulmonary effort is normal.  Abdominal:     General: There is no distension.     Palpations: Abdomen is soft.     Tenderness: There is no abdominal tenderness. There is no guarding.  Musculoskeletal:     Cervical back: Normal range of motion.  Skin:    General: Skin is warm and dry.  Neurological:     Mental Status: She is alert and oriented to person, place, and time.  Psychiatric:        Behavior: Behavior normal.     No results found for this or any previous visit (from the past 24 hour(s)).  No results found.  Assessment/Plan: 44 yo with retained IUD, no strings on exam. U/S confirmed.  Given option for removal reinsertion today, observation. Pt desires observation until IUD expiring. At that time will try for removal, if pain intolerable will discuss schedule hysteroscopic removal.  Follow up in 6 months, IUD precautions given.   Cherre Blanc 05/24/2020, 4:41 PM

## 2020-06-17 ENCOUNTER — Other Ambulatory Visit: Payer: Self-pay | Admitting: Family Medicine

## 2020-06-17 DIAGNOSIS — N898 Other specified noninflammatory disorders of vagina: Secondary | ICD-10-CM

## 2020-06-30 ENCOUNTER — Encounter: Payer: Self-pay | Admitting: Family Medicine

## 2020-06-30 ENCOUNTER — Other Ambulatory Visit: Payer: Self-pay

## 2020-06-30 ENCOUNTER — Ambulatory Visit (INDEPENDENT_AMBULATORY_CARE_PROVIDER_SITE_OTHER): Payer: BC Managed Care – PPO | Admitting: Family Medicine

## 2020-06-30 VITALS — BP 118/82 | HR 64 | Ht <= 58 in | Wt 171.0 lb

## 2020-06-30 DIAGNOSIS — M7989 Other specified soft tissue disorders: Secondary | ICD-10-CM | POA: Insufficient documentation

## 2020-06-30 NOTE — Progress Notes (Signed)
    SUBJECTIVE:   CHIEF COMPLAINT / HPI:   Bump Under Right Arm Patient reports that 1 week ago she noticed a swelling under her right arm She reports that in the few days prior to noticing the swelling she had received a flu vaccination in the same arm She reports otherwise she has been in a normal state of health Denies weight changes or night sweats Also reports that she felt that the swelling went into her right breast and she would like for her breast to be examined No other breast changes noted, no discharge  PERTINENT  PMH / PSH: Obesity, vitamin D deficiency  OBJECTIVE:   BP 118/82   Pulse 64   Ht 4\' 8"  (1.422 m)   Wt 171 lb (77.6 kg)   SpO2 98%   BMI 38.34 kg/m    Physical Exam:  General: 44 y.o. female in NAD Lungs: Breathing comfortably on room air Breast exam: Small hyperpigmented skin tag at 6:00 on the right breast, left breast with linear keloid at 9:00, no abnormalities palpated in all quadrants of bilateral breast, no swelling noted in bilateral axilla, specifically no mass noted in right axilla Skin: warm and dry Extremities: No edema   ASSESSMENT/PLAN:   Right axillary swelling Now resolved.  Discussed with patient that this was likely swollen secondary to flu shot that she received prior to noticing the swelling.  Reassured her that since the swelling is now gone, it is very unlikely that this is bad.  Did discuss breast cancer screening with her, as she would be eligible for mammogram.  She reports that she has been thinking about going to her [facility] for a mammogram screening breast that is coming.  We will go ahead and allow her to do that, she declines a formal order at this time.  She can follow-up in 3 to 6 months for other chronic medical problems.     Cleophas Dunker, [place]

## 2020-06-30 NOTE — Patient Instructions (Signed)
Thank you for coming to see me today. It was a pleasure. Today we talked about:   The lymph node was probably enlarged due to receiving your flu vaccine in the arm.  You can think about getting a mammogram if you would like for screening purposes.  Please follow-up with me 3-6 months or sooner as needed.  If you have any questions or concerns, please do not hesitate to call the office at [phone].  Best,   Arizona Constable, DO

## 2020-06-30 NOTE — Assessment & Plan Note (Signed)
Now resolved.  Discussed with patient that this was likely swollen secondary to flu shot that she received prior to noticing the swelling.  Reassured her that since the swelling is now gone, it is very unlikely that this is bad.  Did discuss breast cancer screening with her, as she would be eligible for mammogram.  She reports that she has been thinking about going to her church for a mammogram screening breast that is coming.  We will go ahead and allow her to do that, she declines a formal order at this time.  She can follow-up in 3 to 6 months for other chronic medical problems.

## 2020-08-10 ENCOUNTER — Ambulatory Visit (INDEPENDENT_AMBULATORY_CARE_PROVIDER_SITE_OTHER): Payer: BC Managed Care – PPO | Admitting: Family Medicine

## 2020-08-10 ENCOUNTER — Other Ambulatory Visit (HOSPITAL_COMMUNITY)
Admission: RE | Admit: 2020-08-10 | Discharge: 2020-08-10 | Disposition: A | Discharge status: Home / Self Care | Payer: BC Managed Care – PPO | Source: Ambulatory Visit | Attending: Family Medicine | Admitting: Family Medicine

## 2020-08-10 ENCOUNTER — Other Ambulatory Visit: Payer: Self-pay

## 2020-08-10 VITALS — BP 104/62 | HR 85 | Ht <= 58 in | Wt 171.0 lb

## 2020-08-10 DIAGNOSIS — B379 Candidiasis, unspecified: Secondary | ICD-10-CM | POA: Insufficient documentation

## 2020-08-10 DIAGNOSIS — N898 Other specified noninflammatory disorders of vagina: Secondary | ICD-10-CM | POA: Insufficient documentation

## 2020-08-10 LAB — POCT WET PREP (WET MOUNT)
Clue Cells Wet Prep Whiff POC: NEGATIVE
Trichomonas Wet Prep HPF POC: ABSENT

## 2020-08-10 MED ORDER — FLUCONAZOLE 150 MG PO TABS
ORAL_TABLET | ORAL | 0 refills | Status: DC
Start: 2020-08-10 — End: 2020-08-22

## 2020-08-10 NOTE — Assessment & Plan Note (Signed)
Positive for yeast on wet prep.  Gave patient 2 doses of fluconazole, 1 to take now and the second to take 72 hours later if symptoms do not improve.  Advised patient to follow-up if symptoms have not improved after 2 doses.  Will call patient regarding her gonorrhea and Chlamydia testing.

## 2020-08-10 NOTE — Patient Instructions (Signed)
It was nice to see you today,  You have a yeast infection.  I have prescribed fluconazole.  If it has not improved after the first dose, take the second dose in 72 hours.  If still not better, please call us to schedule another appointment.    Have a great day,   Clemetine Marker, MD

## 2020-08-10 NOTE — Progress Notes (Signed)
    SUBJECTIVE:   CHIEF COMPLAINT / HPI:   Vaginal discharge: Itching and discharge starting yesterday.  She states she was having "white clumps" no fevers.  Denies dysuria, burning, abdominal pain.  No recent antibiotic use.  Sexually active with one partner. occasoinal condom use.  Patient does not want additional STI testing other than gonorrhea chlamydia swab.  PERTINENT  PMH / PSH: BV, yeast infections  OBJECTIVE:   BP 104/62   Pulse 85   Ht 4\' 8"  (1.422 m)   Wt 171 lb (77.6 kg)   SpO2 96%   BMI 38.34 kg/m   General: Alert and oriented.  No acute distress GU: Normal-appearing labia.  White discharge seen coming from cervix and surrounding cervix.  No bleeding or other lesions.  ASSESSMENT/PLAN:   Yeast infection Positive for yeast on wet prep.  Gave patient 2 doses of fluconazole, 1 to take now and the second to take 72 hours later if symptoms do not improve.  Advised patient to follow-up if symptoms have not improved after 2 doses.  Will call patient regarding her gonorrhea and Chlamydia testing.     Benay Pike, MD [place]

## 2020-08-11 LAB — CERVICOVAGINAL ANCILLARY ONLY
Chlamydia: NEGATIVE
Comment: NEGATIVE
Comment: NORMAL
Neisseria Gonorrhea: NEGATIVE

## 2020-08-22 ENCOUNTER — Ambulatory Visit (INDEPENDENT_AMBULATORY_CARE_PROVIDER_SITE_OTHER): Payer: BC Managed Care – PPO | Admitting: Family Medicine

## 2020-08-22 ENCOUNTER — Other Ambulatory Visit: Payer: Self-pay

## 2020-08-22 VITALS — BP 120/75 | HR 90 | Ht 59.0 in | Wt 173.8 lb

## 2020-08-22 DIAGNOSIS — N898 Other specified noninflammatory disorders of vagina: Secondary | ICD-10-CM

## 2020-08-22 LAB — POCT WET PREP (WET MOUNT)
Clue Cells Wet Prep Whiff POC: NEGATIVE
Trichomonas Wet Prep HPF POC: ABSENT

## 2020-08-22 NOTE — Patient Instructions (Signed)
It was wonderful to see you today.  Today we talked about:  Continued vaginal irritation. I will call you with the results of the wet prep if abnormal. Otherwise I will communicate via My chart.   Please call the clinic at [phone] if your symptoms worsen or you have any concerns. It was our pleasure to serve you.  Dr. Salvadore Dom

## 2020-08-22 NOTE — Progress Notes (Signed)
    SUBJECTIVE:   CHIEF COMPLAINT / HPI:   [name] is a 44 yo F who presents for the issue below.   Vaginal irritation Experiencing vaginal itching and discharge. Concerned for a yeast infection return. Last seen in clinic and treated for yeast 08/10/20. Symptoms improved with treatment but have returned. Declined further STD testing today. Denies dysuria.   PERTINENT  PMH / PSH: As above.   OBJECTIVE:   BP 120/75   Pulse 90   Ht 4\' 11"  (1.499 m)   Wt 173 lb 12.8 oz (78.8 kg)   SpO2 96%   BMI 35.10 kg/m   General: Appears well, no acute distress. Age appropriate. Respiratory: normal effort Pelvic exam: normal external genitalia, vulva, vagina, cervix, uterus and adnexa, WET MOUNT done -results: negative for pathogens, normal epithelial cells, exam chaperoned by , CMA. Extremities: No edema or cyanosis. Skin: Warm and dry, no rashes noted Neuro: alert and oriented Results for orders placed or performed in visit on 08/22/20 (from the past 72 hour(s))  POCT Wet Prep 08/24/20 [place])     Status: None   Collection Time: 08/22/20  3:00 PM  Result Value Ref Range   Source Wet Prep POC VAG    WBC, Wet Prep HPF POC 0-2    Bacteria Wet Prep HPF POC Few Few   Clue Cells Wet Prep HPF POC None None   Clue Cells Wet Prep Whiff POC Negative Whiff    Yeast Wet Prep HPF POC None None   KOH Wet Prep POC None None   Trichomonas Wet Prep HPF POC Absent Absent   ASSESSMENT/PLAN:   Vaginal discharge Recent yeast infection. Wet prep today unremarkable. Patient to be notified via phone of results; results available following visit completion. Could consider changing hygiene products. Urine not obtain and symptoms not consistent with UTI but could consider obtaining urine if symptoms persist. Will suggest changes in hygiene regimen or use shared decision making with patient and consider a dose of diflucan for symptomatic relief.   08/24/20, DO [facility]

## 2020-08-24 NOTE — Assessment & Plan Note (Addendum)
Recent yeast infection. Wet prep today unremarkable. Patient to be notified via phone of results; results available following visit completion. Could consider changing hygiene products. Urine not obtain and symptoms not consistent with UTI but could consider obtaining urine if symptoms persist. Will suggest changes in hygiene regimen or use shared decision making with patient and consider a dose of diflucan for symptomatic relief.

## 2020-10-16 NOTE — Progress Notes (Signed)
    SUBJECTIVE:   CHIEF COMPLAINT / HPI: R hip pain  R Hip pain: 45 yo woman presents today with right hip pain x2-3 months. She first noticed it after going roller skating about 2-3 months ago. Pain is throbbing and aching in lateral hip and buttock. 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 She notices that it aches if she lays on that side at night, no pain in groin. Able to do normal function: walk, exercise, climb stairs, but with pain.  PERTINENT  PMH / PSH: obesity  OBJECTIVE:   BP 112/74   Pulse 70   Ht 4\' 11"  (1.499 m)   Wt 171 lb 3.2 oz (77.7 kg)   SpO2 96%   BMI 34.58 kg/m   Nursing note and vitals reviewed GEN: age-appropriate, AAW, resting comfortably in chair, NAD, obese HEENT: NCAT.  Sclera without injection or icterus.  Hip, R: No obvious rash, erythema, ecchymosis, or edema. ROM full in all directions; Strength 4/5 in IR/ER/Flex/Ext/Abd/Add, specifically quite weak in hip flexors and glutes. Pelvic alignment unremarkable to inspection and palpation. Standing hip rotation and gait without trendelenburg / unsteadiness. Greater trochanter without tenderness to palpation. Tenderness over piriformis and SI joint; normal minimal SI movement.  Neuro: Alert and at baseline Ext: no edema Psych: Pleasant and appropriate  ASSESSMENT/PLAN:   Right hip pain Patient with weak hip flexors, glutes, and tender SI and piriformis on Right side. 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 Discussed options of conservative tx including exercises, NSAIDs, ice/heat, stretching, referral to formal PT, etc. She is interested in HEP at this time, given hip flexor/glute/piriformis strengthening exercises from Sports Medicine Patient Advisor. Recommend she do these exercises 3-5x per week with 3 repetitions. Will do mobic 15mg  qd x15days.  Continue aspercreme, ice, heat, stretching. Will obtain x-rays of pelvis as pain has returned. F/u in 3-4 weeks if no improvement.     Mandy Damme, MD [name]

## 2020-10-17 ENCOUNTER — Ambulatory Visit (INDEPENDENT_AMBULATORY_CARE_PROVIDER_SITE_OTHER): Payer: Medicaid Other | Admitting: Family Medicine

## 2020-10-17 ENCOUNTER — Other Ambulatory Visit: Payer: Self-pay

## 2020-10-17 VITALS — BP 112/74 | HR 70 | Ht 59.0 in | Wt 171.2 lb

## 2020-10-17 DIAGNOSIS — M25551 Pain in right hip: Secondary | ICD-10-CM

## 2020-10-17 MED ORDER — MELOXICAM 15 MG PO TABS: 15.0000 mg | ORAL_TABLET | Freq: Every day | ORAL | 0 refills | Status: AC

## 2020-10-17 NOTE — Patient Instructions (Signed)
It was a pleasure to see you today!  1. You have strain and weakness of your gluteus muscles, piriformis muscle, and hip flexor muscles. Please do the exercises listed about 5 x per week with three repetitions each time. Use mobic once a day for 15 days. You can also use ice or heat and aspercreme as you have been.  2. Please get x-rays, see hand out. I will let you know results when I have them.  3. Follow up if you are not better in 3-4 weeks.    Be Well,  Dr. Chauncey Reading

## 2020-10-17 NOTE — Assessment & Plan Note (Signed)
Patient with weak hip flexors, glutes, and tender SI and piriformis on Right side. 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 Discussed options of conservative tx including exercises, NSAIDs, ice/heat, stretching, referral to formal PT, etc. She is interested in HEP at this time, given hip flexor/glute/piriformis strengthening exercises from Sports Medicine Patient Advisor. Recommend she do these exercises 3-5x per week with 3 repetitions. Will do mobic 15mg  qd x15days. Continue aspercreme, ice, heat, stretching. Will obtain x-rays of pelvis as pain has returned. F/u in 3-4 weeks if no improvement.

## 2020-11-09 ENCOUNTER — Other Ambulatory Visit: Payer: Self-pay

## 2020-11-09 ENCOUNTER — Ambulatory Visit (INDEPENDENT_AMBULATORY_CARE_PROVIDER_SITE_OTHER): Payer: Medicaid Other | Admitting: Family Medicine

## 2020-11-09 VITALS — BP 110/64 | HR 70 | Ht 59.0 in | Wt 171.8 lb

## 2020-11-09 DIAGNOSIS — M7061 Trochanteric bursitis, right hip: Secondary | ICD-10-CM | POA: Diagnosis present

## 2020-11-09 DIAGNOSIS — G589 Mononeuropathy, unspecified: Secondary | ICD-10-CM

## 2020-11-09 NOTE — Patient Instructions (Signed)
It was great seeing you today.  I believe your right hip pain is from what is called trochanteric bursitis.  Below is some information on this.  The major treatment for this is an injection but you can also try a foam roller to roll over where the pain is.  Regarding your knee I think the pain is related to a nerve being pressed on when you make certain movements.  The major way to help with this is weight loss.  Below are some stretches that may help with the symptoms.  If your symptoms worsen or you have any questions or concerns please feel free to call the clinic.  Happy of wonderful afternoon!   Hip Bursitis  Hip bursitis is swelling of one or more fluid-filled sacs (bursae) in your hip joint. This condition can cause pain, and your symptoms may come and go over time. What are the causes?  Repeated use of your hip muscles.  Injury to the hip.  Weak butt muscles.  Bone spurs.  Infection. In some cases, the cause may not be known. What increases the risk? You are more likely to develop this condition if:  You had a past hip injury or hip surgery.  You have a condition, such as arthritis, gout, diabetes, or thyroid disease.  You have spine problems.  You have one leg that is shorter than the other.  You run a lot or do long-distance running.  You play sports where there is a risk of injury or falling, such as football, martial arts, or skiing. What are the signs or symptoms? Symptoms may come and go, and they often include:  Pain in the hip or groin area. Pain may get worse when you move your hip.  Tenderness and swelling of the hip. In rare cases, the bursa may become infected. If this happens, you may get a fever, as well as have warmth and redness in the hip area. How is this treated? This condition is treated by:  Resting your hip.  Icing your hip.  Wrapping the hip area with an elastic bandage (compression wrap).  Keeping the hip raised. Other treatments may  include medicine, draining fluid out of the bursa, or using crutches, a cane, or a walker. Surgery may be needed, but this is rare. Long-term treatment may include doing exercises to help your strength and flexibility. It may also include lifestyle changes like losing weight to lessen the strain on your hip. Follow these instructions at home: Managing pain, stiffness, and swelling  If told, put ice on the painful area. ? Put ice in a plastic bag. ? Place a towel between your skin and the bag. ? Leave the ice on for 20 minutes, 2-3 times a day.  Raise your hip by putting a pillow under your hips while you lie down. Stop if you feel pain.  If told, put heat on the affected area. Do this as often as told by your doctor. Use a moist heat pack or a heating pad as told by your doctor. ? Place a towel between your skin and the heat source. ? Leave the heat on for 20-30 minutes. ? Take off the heat if your skin turns bright red. This is very important if you are unable to feel pain, heat, or cold. You may have a greater risk of getting burned.      Activity  Do not use your hip to support your body weight until your doctor says that you can.  Use crutches, a cane, or a walker as told by your doctor.  If the affected leg is one that you use to drive, ask your doctor if it is safe to drive.  Rest and protect your hip as much as you can until you feel better.  Return to your normal activities as told by your doctor. Ask your doctor what activities are safe for you.  Do exercises as told by your doctor. General instructions  Take over-the-counter and prescription medicines only as told by your doctor.  Gently rub and stretch your injured area as often as is comfortable.  Wear elastic bandages only as told by your doctor.  If one of your legs is shorter than the other, get fitted for a shoe insert or orthotic.  Keep a healthy weight. Follow instructions from your doctor.  Keep all  follow-up visits as told by your doctor. This is important. How is this prevented?  Exercise regularly, as told by your doctor.  Wear the right shoes for the sport you play.  Warm up and stretch before being active. Cool down and stretch after being active.  Take breaks often from repeated activity.  Avoid activities that bother your hip or cause pain.  Avoid sitting down for a long time. Where to find more information  American Academy of Orthopaedic Surgeons: orthoinfo.aaos.org Contact a doctor if:  You have a fever.  You have new symptoms.  You have trouble walking or doing everyday activities.  You have pain that gets worse or does not get better with medicine.  Your skin around your hip is red.  You get a feeling of warmth in your hip area. Get help right away if:  You cannot move your hip.  You have very bad pain.  You cannot control the muscles in your feet. Summary  Hip bursitis is swelling of one or more fluid-filled sacs (bursae) in your hip joint.  Symptoms often come and go over time.  This condition is often treated by resting and icing the hip. It also may help to keep the area raised and wrapped in an elastic bandage. Other treatments may be needed. This information is not intended to replace advice given to you by your health care provider. Make sure you discuss any questions you have with your health care provider. Document Revised: 06/14/2019 Document Reviewed: 04/20/2018 Elsevier Patient Education  [place].   Common Peroneal Nerve Entrapment Rehab Ask your health care provider which exercises are safe for you. Do exercises exactly as told by your health care provider and adjust them as directed. It is normal to feel mild stretching, pulling, tightness, or discomfort as you do these exercises. Stop right away if you feel sudden pain or your pain gets worse. Do not begin these exercises until told by your health care provider. Stretching  and range-of-motion exercises These exercises warm up your muscles and joints and improve the movement and flexibility of your ankle. The exercises also help to relieve pain, numbness, and tingling. Gastroc stretch, standing This exercise is sometimes called a standing calf stretch. It stretches the muscles in the back of the upper calf (gastroc). To do this exercise: 1. Stand with your hands against a wall. 2. Extend your left / right leg behind you, and bend your front knee slightly. 3. Keeping your heels on the floor and your back knee straight, shift your weight slightly toward the wall. Do not arch your back. You should feel a gentle stretch in your  upper left / right calf. 4. Hold this position for __________ seconds. Repeat __________ times. Complete this exercise __________ times a day.   Soleus stretch, standing This exercise is sometimes called a standing calf stretch. It stretches the muscles in the back of the lower calf (soleus). To do this exercise: 1. Stand with your hands against a wall. 2. Extend your left / right leg behind you, and bend your front knee slightly. 3. Keeping your heels on the floor, bend your back knee and shift your weight slightly over your back leg. You should feel a gentle stretch deep in your lower calf. 4. Hold this position for __________ seconds. Repeat __________ times. Complete this exercise __________ times a day. Fibular nerve floss This is an exercise in which you hold your leg out and move your foot. To do this exercise: 1. Sit with your legs straight in front of you. 2. Slowly move your left / right foot down and inward so your toes point toward the floor and toward your other foot. 3. Hold this position for __________ seconds. 4. Slowly return your foot to the starting position. Repeat __________ times. Complete this exercise __________ times a day. Hamstring stretch, standing This is an exercise in which you prop your leg on a chair and lean  forward to achieve a stretch. To do this exercise: 1. Stand with your left / right heel resting on a chair. Your left / right leg should be fully extended. 2. Arch your lower back slightly. 3. Lean forward at the waist, leading with your chest, until you feel a gentle stretch in the back of your left / right knee or thigh. You should not need to lean far to feel the stretch. 4. Hold this position for __________ seconds. Repeat __________ times. Complete this exercise __________ times a day.   This information is not intended to replace advice given to you by your health care provider. Make sure you discuss any questions you have with your health care provider. Document Revised: 12/02/2018 Document Reviewed: 06/10/2018 Elsevier Patient Education  2021 [employer].

## 2020-11-09 NOTE — Progress Notes (Signed)
    SUBJECTIVE:   CHIEF COMPLAINT / HPI:   Right hip pain  Patient reports extreme tenderness to palpation in the right hip right on the outside.  Reports worse with movement.  She cannot find anything to make it better.  She has been taking NSAIDs with mild relief.  Left knee pain  Patient is experiencing a shooting numbness and tingling sensation in her first and second digit of her left lower extremity which she reports starts at her knee and then goes down.  She cannot identify anything that makes it better or worse.  The sensation comes and goes.  She is concerned because she does not know what causes it.  OBJECTIVE:   BP 110/64   Pulse 70   Ht 4\' 11"  (1.499 m)   Wt 171 lb 12.8 oz (77.9 kg)   SpO2 97%   BMI 34.70 kg/m   General: Well-appearing 45 year old female in no acute distress Cardiac: Regular rate and rhythm, no murmurs appreciated Respiratory: Normal work of breathing MSK: Tenderness to palpation over greater trochanter on right hip, no erythema edema.  Full range of motion in the hips as well as the knees.  Sensation is intact in lower extremities bilaterally.  ASSESSMENT/PLAN:   Trochanteric bursitis of right hip Patient with signs and symptoms consistent with trochanteric bursitis in the right hip.  I discussed injection and the patient is not willing to get an injection at this time.  We will try NSAIDs and a foam roller as well as I provided stretching episodes to help with this.  Patient will call and schedule an appointment if she decides that she wants an injection in her hip.  Mononeuropathy Patient's left lower extremity numbness and tingling is most likely related to compression of the peroneal nerve at the knee.  She has tingling that radiates into her first and second digit as well as the medial anterior aspect of her left lower extremity.  This occurs intermittently.  Discussed weight loss as being the common treatment to help decrease the impingement of the  nerve.  Patient is understanding     Gifford Shave, MD [place]

## 2020-11-10 ENCOUNTER — Ambulatory Visit: Payer: Medicaid Other

## 2020-11-10 DIAGNOSIS — G589 Mononeuropathy, unspecified: Secondary | ICD-10-CM | POA: Insufficient documentation

## 2020-11-10 DIAGNOSIS — M7061 Trochanteric bursitis, right hip: Secondary | ICD-10-CM | POA: Insufficient documentation

## 2020-11-10 NOTE — Assessment & Plan Note (Signed)
Patient's left lower extremity numbness and tingling is most likely related to compression of the peroneal nerve at the knee.  She has tingling that radiates into her first and second digit as well as the medial anterior aspect of her left lower extremity.  This occurs intermittently.  Discussed weight loss as being the common treatment to help decrease the impingement of the nerve.  Patient is understanding

## 2020-11-10 NOTE — Assessment & Plan Note (Signed)
Patient with signs and symptoms consistent with trochanteric bursitis in the right hip.  I discussed injection and the patient is not willing to get an injection at this time.  We will try NSAIDs and a foam roller as well as I provided stretching episodes to help with this.  Patient will call and schedule an appointment if she decides that she wants an injection in her hip.

## 2021-01-30 ENCOUNTER — Encounter: Payer: Self-pay | Admitting: Student in an Organized Health Care Education/Training Program

## 2021-01-30 ENCOUNTER — Ambulatory Visit (INDEPENDENT_AMBULATORY_CARE_PROVIDER_SITE_OTHER): Payer: Medicaid Other | Admitting: Student in an Organized Health Care Education/Training Program

## 2021-01-30 ENCOUNTER — Other Ambulatory Visit: Payer: Self-pay

## 2021-01-30 DIAGNOSIS — H6120 Impacted cerumen, unspecified ear: Secondary | ICD-10-CM | POA: Diagnosis not present

## 2021-01-30 NOTE — Progress Notes (Signed)
    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 female in NAD HEENT: PERRL, EOMI Lungs: Breathing comfortably on room air Skin: warm and dry Extremities: 5/5 strength BUE/BLE Neuro: Cranial nerves II through XII grossly intact, sensation intact throughout   ASSESSMENT/PLAN:   Elevated blood pressure reading Patient reports elevated blood pressure readings are usually associated with headaches.  Unclear if the headache causes her elevated blood pressure or if she is getting headaches because of her elevated blood pressure.  Her blood pressure is well controlled today.  We will send her for ambulatory blood pressure monitoring with our pharmacy team and determine need for medication based off of this.  BMP performed today.  Nonintractable episodic headache She has had headaches in the past, this is not new for her.  She is neurologically intact on exam.  Could consider that these are contributing to her elevated blood pressure or  are caused by her elevated blood pressure.  She can continue with her current treatments at home including eating or using vinegar.  We will follow-up blood pressure per above.   Advised to follow-up in 1 month with new PCP.  Cleophas Dunker, [place]

## 2021-01-30 NOTE — Progress Notes (Signed)
   SUBJECTIVE:   CHIEF COMPLAINT / HPI: needs ears cleaned out  Ear concerns-patient is hard of hearing and being fitted for hearing aids this week from audiology.  They advised her to get her ears cleaned out by her PCP prior to the fitting as her canals were blocked with wax.  She has been using eardrops and trying to clean out her canals at home for about a week now.  She thinks there is still wax in the canals.  Denies pain or dizziness.  PERTINENT  PMH / PSH: h/o hearing loss  OBJECTIVE:   BP 122/72   Pulse 76   Wt 173 lb (78.5 kg)   SpO2 93%   BMI 34.94 kg/m   Physical Exam Vitals and nursing note reviewed.  Constitutional:      General: She is not in acute distress.    Appearance: She is not ill-appearing or toxic-appearing.  HENT:     Right Ear: Tympanic membrane and external ear normal. Decreased hearing noted. Laceration, drainage and tenderness present. There is impacted cerumen.     Left Ear: Tympanic membrane and external ear normal. Decreased hearing noted. Tenderness present. There is impacted cerumen.     Nose: No congestion.  Pulmonary:     Effort: Pulmonary effort is normal.  Neurological:     General: No focal deficit present.     Mental Status: She is alert and oriented to person, place, and time.  Psychiatric:        Mood and Affect: Mood normal.        Behavior: Behavior normal.      ASSESSMENT/PLAN:   Cerumen in auditory canal on examination Patient needing canals cleaned out for hearing device fitting this week. Initial exam was not able to view TM bilaterally due to cerumen and foreign material likely the solution she had been using at home to clear out her ears.  There was also an abrasion in her right ear canal.  Was unable to clear the debris with curette only.  CMA provided flushing of bilateral ears which cleared some debris and was able to view bilateral TMs which looked normal.  Patient experienced some dizziness initially from the flushing  which resolved prior to leaving the appointment, per patient. -To continue to be a small amount of debris in bilateral ear canals which did not obstruct view of the TM and will likely not prevent her device fitting this week. -Refer to ENT for further evaluation and future ear cleanings     Carmel [place]

## 2021-01-31 DIAGNOSIS — H6120 Impacted cerumen, unspecified ear: Secondary | ICD-10-CM | POA: Insufficient documentation

## 2021-01-31 NOTE — Assessment & Plan Note (Signed)
Patient needing canals cleaned out for hearing device fitting this week. Initial exam was not able to view TM bilaterally due to cerumen and foreign material likely the solution she had been using at home to clear out her ears.  There was also an abrasion in her right ear canal.  Was unable to clear the debris with curette only.  CMA provided flushing of bilateral ears which cleared some debris and was able to view bilateral TMs which looked normal.  Patient experienced some dizziness initially from the flushing which resolved prior to leaving the appointment, per patient. -To continue to be a small amount of debris in bilateral ear canals which did not obstruct view of the TM and will likely not prevent her device fitting this week. -Refer to ENT for further evaluation and future ear cleanings

## 2021-02-01 ENCOUNTER — Other Ambulatory Visit: Payer: Self-pay

## 2021-02-01 ENCOUNTER — Encounter: Payer: Self-pay | Admitting: Family Medicine

## 2021-02-01 ENCOUNTER — Ambulatory Visit (INDEPENDENT_AMBULATORY_CARE_PROVIDER_SITE_OTHER): Payer: Medicaid Other | Admitting: Family Medicine

## 2021-02-01 VITALS — BP 124/75 | HR 79 | Ht 59.0 in | Wt 172.6 lb

## 2021-02-01 DIAGNOSIS — R519 Headache, unspecified: Secondary | ICD-10-CM | POA: Insufficient documentation

## 2021-02-01 DIAGNOSIS — Z1159 Encounter for screening for other viral diseases: Secondary | ICD-10-CM | POA: Diagnosis not present

## 2021-02-01 DIAGNOSIS — R030 Elevated blood-pressure reading, without diagnosis of hypertension: Secondary | ICD-10-CM | POA: Insufficient documentation

## 2021-02-01 NOTE — Patient Instructions (Signed)
Thank you for coming to see me today. It was a pleasure. Today we talked about:   We will get some labs today.  If they are abnormal or we need to do something about them, I will call you.  If they are normal, I will send you a message on MyChart (if it is active) or a letter in the mail.  If you don't hear from [place] in 2 weeks, please call the office at the number below.   If you don't hear from the pharmacy team in 2 weeks about your blood pressure monitoring, please call us.  Please follow-up with new PCP in 1 month.  If you have any questions or concerns, please do not hesitate to call the office at [phone].  Best,   [place] Constable, DO

## 2021-02-01 NOTE — Assessment & Plan Note (Addendum)
She has had headaches in the past, this is not new for her.  She is neurologically intact on exam.  Could consider that these are contributing to her elevated blood pressure or are caused by her elevated blood pressure.  She can continue with her current treatments at home including eating or using vinegar.  We will follow-up blood pressure per above.

## 2021-02-01 NOTE — Assessment & Plan Note (Addendum)
Patient reports elevated blood pressure readings are usually associated with headaches.  Unclear if the headache causes her elevated blood pressure or if she is getting headaches because of her elevated blood pressure.  Her blood pressure is well controlled today.  We will send her for ambulatory blood pressure monitoring with our pharmacy team and determine need for medication based off of this.  BMP performed today.

## 2021-02-02 LAB — HCV AB W REFLEX TO QUANT PCR: HCV Ab: <0.1 s/co ratio (ref 0.0–0.9)

## 2021-02-02 LAB — BASIC METABOLIC PANEL
BUN/Creatinine Ratio: 7 — ABNORMAL LOW (ref 9–23)
BUN: 6 mg/dL (ref 6–24)
CO2: 25 mmol/L (ref 20–29)
Calcium: 9.5 mg/dL (ref 8.7–10.2)
Chloride: 100 mmol/L (ref 96–106)
Creatinine, Ser: 0.84 mg/dL (ref 0.57–1.00)
Glucose: 100 mg/dL — ABNORMAL HIGH (ref 65–99)
Potassium: 4.1 mmol/L (ref 3.5–5.2)
Sodium: 138 mmol/L (ref 134–144)
eGFR: 88 mL/min/{1.73_m2} (ref >59)

## 2021-02-02 LAB — HCV INTERPRETATION

## 2021-03-06 ENCOUNTER — Other Ambulatory Visit: Payer: Self-pay

## 2021-03-06 ENCOUNTER — Ambulatory Visit (INDEPENDENT_AMBULATORY_CARE_PROVIDER_SITE_OTHER): Payer: Medicaid Other | Admitting: Pharmacist

## 2021-03-06 ENCOUNTER — Encounter: Payer: Self-pay | Admitting: Pharmacist

## 2021-03-06 DIAGNOSIS — R030 Elevated blood-pressure reading, without diagnosis of hypertension: Secondary | ICD-10-CM

## 2021-03-06 NOTE — Patient Instructions (Addendum)
Blood Pressure Activity Diary Time Lying down/ Sleeping Walking/ Exercise Stressed/ Angry Headache/ Pain Dizzy  9 AM       10 AM       11 AM       12 PM       1 PM       2 PM       Time Lying down/ Sleeping Walking/ Exercise Stressed/ Angry Headache/ Pain Dizzy  3 PM       4 PM        5 PM       6 PM       7 PM       8 PM       Time Lying down/ Sleeping Walking/ Exercise Stressed/ Angry Headache/ Pain Dizzy  9 PM       10 PM       11 PM       12 AM       1 AM       2 AM       3 AM       Time Lying down/ Sleeping Walking/ Exercise Stressed/ Angry Headache/ Pain Dizzy  4 AM       5 AM       6 AM       7 AM       8 AM       9 AM       10 AM        Time you woke up: _________                  Time you went to sleep:__________  Come back tomorrow at 10:40 to have the monitor removed Call the [facility] if you have any questions before then ([phone])  Wearing the Blood Pressure Monitor The cuff will inflate every 20 minutes during the day and every 30 minutes while you sleep. Your blood pressure readings will NOT display after cuff inflation Fill out the blood pressure-activity diary during the day, especially during activities that may affect your reading -- such as exercise, stress, walking, taking your blood pressure medications  Important things to know: Avoid taking the monitor off for the next 24 hours, unless it causes you discomfort or pain. Do NOT get the monitor wet and do NOT dry to clean the monitor with any cleaning products. Do NOT put the monitor on anyone else's arm. When the cuff inflates, avoid excess movement. Let the cuffed arm hang loosely, slightly away from the body. Avoid flexing the muscles or moving the hand/fingers. When you go to sleep, make sure that the hose is not kinked. Remember to fill out the blood pressure activity diary. If you experience severe pain or unusual pain (not associated with getting your blood pressure  checked), remove the monitor.  Troubleshooting:  Code  Troubleshooting   1  Check cuff position, tighten cuff   2, 3  Remain still during reading   4, 87  Check air hose connections and make sure cuff is tight   85, 89  Check hose connections and make tubing is not crimped   86  Push START/STOP to restart reading   88, 91  Retry by pushing START/STOP   90  Replace batteries. If problem persists, remove monitor and bring back to   clinic at follow up   97, 98, 99  Service required - Remove monitor and bring back to clinic at  follow up    Blood pressure evaluation normal - no change or initiation of any medications at this time.

## 2021-03-06 NOTE — Progress Notes (Signed)
   S:    Patient arrives in good spirits, well dressed with fancy hand-fan. Presents to the clinic for ambulatory blood pressure evaluation.   Patient was referred and last seen by Primary Care Provider, Dr. Sandi Carne on 02/01/2021.   Patient recently found to have several elevated blood pressure readings in the office, at work and at home.   Patient reports high blood pressure in mother and possibly her brother.   She takes minimal pills or supplements.  She would like to avoid medications.    Discussed procedure for wearing the monitor and gave patient written instructions. Monitor was placed on non-dominant arm with instructions to return in the morning.   Current BP Medications include:  NONE  Antihypertensives tried in the past include: NONE   Day #2 - patient returns to clinic wearing amb BP monitor  She states she had no issues with device.    O:  Physical Exam Constitutional:      Appearance: Normal appearance.  Pulmonary:     Effort: Pulmonary effort is normal.  Neurological:     Mental Status: She is alert.  Psychiatric:        Mood and Affect: Mood normal.        Behavior: Behavior normal.        Thought Content: Thought content normal.    ROS  Last 3 Office BP readings: BP Readings from Last 3 Encounters:  03/06/21 128/61  02/01/21 124/75  01/30/21 122/72     Clinical Atherosclerotic Cardiovascular Disease (ASCVD): No  The ASCVD Risk score Mikey Bussing DC Jr., et al., 2013) failed to calculate for the following reasons:   Cannot find a previous HDL lab   Cannot find a previous total cholesterol lab  Basic Metabolic Panel    Component Value Date/Time   NA 138 02/01/2021 1122   K 4.1 02/01/2021 1122   CL 100 02/01/2021 1122   CO2 25 02/01/2021 1122   GLUCOSE 100 (H) 02/01/2021 1122   GLUCOSE 96 07/22/2016 1212   BUN 6 02/01/2021 1122   CREATININE 0.84 02/01/2021 1122   CREATININE 0.86 07/22/2016 1212   CALCIUM 9.5 02/01/2021 1122   GFRNONAA 80  10/08/2019 1226   GFRAA 92 10/08/2019 1226    Renal function: CrCl cannot be calculated (Patient's most recent lab result is older than the maximum 21 days allowed.).   ABPM Study Data: Arm Placement left arm  Overall Mean 24hr BP:   127/76 mmHg HR: 79  Daytime Mean BP:  132/80 mmHg HR: 80  Nighttime Mean BP:  117/69 mmHg HR: 77  Dipping Pattern: Yes.    Sys:   11.7%   Dia: 14.4%   [normal dipping ~10-20%]  Non-hypertensive ABPM thresholds: daytime BP <125/75 mmHg, sleeptime BP <120/70 mmHg     A/P: History of intermittent elevated blood pressure recently found to have normal blood pressure with ambulatory blood pressure assessment.  Given 24-hour ambulatory blood pressure demonstrates good control with an average awake blood pressure of 132/.80 mmHg, and a nocturnal dipping pattern that is normal - suggest continued monitoring as this patient is currently at the level where therapy could be initiated in the near future.   Changes to medications  None. - suggest continued efforts on dietary and exercise interventions to help control blood pressure.    Results reviewed and written information provided.  Total time in face-to-face counseling 22 minutes.   F/U Clinic Visit with Dr. Rock Nephew PRN.

## 2021-03-07 NOTE — Progress Notes (Signed)
Reviewed: I agree with the documentation and management of Dr. Koval. 

## 2021-03-07 NOTE — Assessment & Plan Note (Signed)
History of intermittent elevated blood pressure recently found to have normal blood pressure with ambulatory blood pressure assessment.  Given 24-hour ambulatory blood pressure demonstrates good control with an average awake blood pressure of 132/.80 mmHg, and a nocturnal dipping pattern that is normal - suggest continued monitoring as this patient is currently at the level where therapy could be initiated in the near future.   Changes to medications  None. - suggest continued efforts on dietary and exercise interventions to help control blood pressure.

## 2021-04-17 ENCOUNTER — Encounter: Payer: Self-pay | Admitting: Family Medicine

## 2021-04-17 ENCOUNTER — Other Ambulatory Visit: Payer: Self-pay

## 2021-04-17 ENCOUNTER — Other Ambulatory Visit (HOSPITAL_COMMUNITY)
Admission: RE | Admit: 2021-04-17 | Discharge: 2021-04-17 | Disposition: A | Discharge status: Home / Self Care | Payer: Medicaid Other | Source: Ambulatory Visit | Attending: Family Medicine | Admitting: Family Medicine

## 2021-04-17 ENCOUNTER — Ambulatory Visit (INDEPENDENT_AMBULATORY_CARE_PROVIDER_SITE_OTHER): Payer: Medicaid Other | Admitting: Family Medicine

## 2021-04-17 VITALS — BP 119/74 | HR 86 | Wt 175.4 lb

## 2021-04-17 DIAGNOSIS — N898 Other specified noninflammatory disorders of vagina: Secondary | ICD-10-CM

## 2021-04-17 DIAGNOSIS — T839XXD Unspecified complication of genitourinary prosthetic device, implant and graft, subsequent encounter: Secondary | ICD-10-CM

## 2021-04-17 DIAGNOSIS — Z113 Encounter for screening for infections with a predominantly sexual mode of transmission: Secondary | ICD-10-CM | POA: Diagnosis not present

## 2021-04-17 DIAGNOSIS — T839XXA Unspecified complication of genitourinary prosthetic device, implant and graft, initial encounter: Secondary | ICD-10-CM | POA: Insufficient documentation

## 2021-04-17 DIAGNOSIS — K649 Unspecified hemorrhoids: Secondary | ICD-10-CM | POA: Insufficient documentation

## 2021-04-17 DIAGNOSIS — R030 Elevated blood-pressure reading, without diagnosis of hypertension: Secondary | ICD-10-CM

## 2021-04-17 DIAGNOSIS — K640 First degree hemorrhoids: Secondary | ICD-10-CM

## 2021-04-17 DIAGNOSIS — Z114 Encounter for screening for human immunodeficiency virus [HIV]: Secondary | ICD-10-CM | POA: Diagnosis not present

## 2021-04-17 NOTE — Progress Notes (Signed)
    SUBJECTIVE:   CHIEF COMPLAINT / HPI: vaginal dryness  Vaginal Dryness: Patient reports a feeling of dryness externally. UTD with pap, NILM and HPV neg in 11/2019, next due in April 2026. Reports menstrual periods have been gone since IUD placement 6-7 years ago. She is making enough lubrication so that intercourse is not painful without additional exogenous lubrication. Occasional itching persistent over long period of time, sometimes discharge, various colors. She was using baby powder, but stopped this and still has dry sensation. Patient elects to have STI testing done today.  IUD: Patient's IUD expired in April 2022. In July 2021, strings could not be located, but IUD was found to be correctly positioned in uterus on 2 ultrasounds. Patient followed up with OBGYN in September 2021, who recommended waiting until expiration date in April 2022 per patient's wishes to be removed.   Rectal bleeding: feels a lump on her rectum with occasional light bleeding, which she notices on toilet paper after wiping. She has some itching and discomfort in the same area. She had a BM today, normal and soft. However, she reports long history of constipation. She uses metamucil regularly, has not tried any laxatives.  H/o Elevated BP: BP today is at goal: 119/74. She had ambulatory 24 hour cuff done in July with Dr. Valentina Lucks that showed avg BP of 132/80.   PERTINENT  PMH / PSH: hearing loss, obesity, elevated BP reading  OBJECTIVE:   BP 119/74   Pulse 86   Wt 175 lb 6.4 oz (79.6 kg)   SpO2 100%   BMI 35.43 kg/m   Nursing note and vitals reviewed GEN: age appropriate AAW, resting comfortably in chair, NAD, WNWD, alert and at baseline PELVIC:  Normal appearing external female genitalia, normal vaginal epithelium, no abnormal discharge. Normal appearing cervix. IUD strings not observed No palpable adnexal masses.  Normal rectum with 1 small nodule aprox 26m at 6 o'clock c/w hemorrhoid. Ext: no edema Psych:  Pleasant and appropriate   ASSESSMENT/PLAN:   Elevated blood pressure reading BP at goal today, continue to monitor.   Vaginal dryness Patient reports vaginal dryness, but history is scant. She does not have dyspareunia, does not require exogenous lubrication for intercourse. Her external skin and vaginal mucosa appear normal, no signs of dryness. Recommend she use external lotion for skin, recommend using lubrication for sexual intercourse, especially if becomes painful. Denies odor, discharge, and itching, results for candidiasis, BV, trich, GC, HIV, RPR pending.  Hemorrhoid Patient has uncomplicated, small hemorrhoid. Recommend witch hazel, preparation H, or sitz baths for treatment. Also recommend treating long standing constipation with continued metamucil, increase fiber with fresh fruits and vegetables, and titrate miralax. If miralax alone does not work, can add senna. Instructions for titration of miralax given.  IUD complication (HSan German Patient's mirena has expired in April 2022, but the strings are not visible on exam. Patient has been to OBGYN before for this, recommend she return for removal as she may need hysteroscopy.     CGladys Damme MD [place]

## 2021-04-17 NOTE — Assessment & Plan Note (Signed)
Patient has uncomplicated, small hemorrhoid. Recommend witch hazel, preparation H, or sitz baths for treatment. Also recommend treating long standing constipation with continued metamucil, increase fiber with fresh fruits and vegetables, and titrate miralax. If miralax alone does not work, can add senna. Instructions for titration of miralax given.

## 2021-04-17 NOTE — Patient Instructions (Signed)
It was a pleasure to see you today!  I recommend you make an appointment at the OBGYN's office to have your IUD removed. For your rectal bleeding: you have a hemorrhoid, which is due to constipation. To help with itching, you can use witch hazel wipes, preparation H or sitz baths. The best way to treat this is to treat your constipation: continue to use metamucil, also add miralax start with 1 scoop once a day in 4 oz of water, if this does not work, increase to 2scoops once a day in 8 oz of water. If that does not work, increase to 2 scoops twice a day, etc.  3. We will get some labs today.  If they are abnormal or we need to do something about them, I will call you.  If they are normal, I will send you a message on MyChart (if it is active) or a letter in the mail.  If you don't hear from [place] in 2 weeks, please call the office  [phone].   Be Well,  Dr. Chauncey Reading

## 2021-04-17 NOTE — Assessment & Plan Note (Addendum)
Patient reports vaginal dryness, but history is scant. She does not have dyspareunia, does not require exogenous lubrication for intercourse. Her external skin and vaginal mucosa appear normal, no signs of dryness. Recommend she use external lotion for skin, recommend using lubrication for sexual intercourse, especially if becomes painful. Denies odor, discharge, and itching, results for candidiasis, BV, trich, GC, HIV, RPR pending.

## 2021-04-17 NOTE — Assessment & Plan Note (Signed)
Patient's mirena has expired in April 2022, but the strings are not visible on exam. Patient has been to OBGYN before for this, recommend she return for removal as she may need hysteroscopy.

## 2021-04-17 NOTE — Assessment & Plan Note (Signed)
BP at goal today, continue to monitor.

## 2021-04-18 LAB — CERVICOVAGINAL ANCILLARY ONLY
Bacterial Vaginitis (gardnerella): NEGATIVE
Candida Glabrata: NEGATIVE
Candida Vaginitis: NEGATIVE
Chlamydia: NEGATIVE
Comment: NEGATIVE
Comment: NEGATIVE
Comment: NEGATIVE
Comment: NEGATIVE
Comment: NEGATIVE
Comment: NORMAL
Neisseria Gonorrhea: NEGATIVE
Trichomonas: NEGATIVE

## 2021-04-18 LAB — RPR: RPR Ser Ql: NONREACTIVE

## 2021-04-18 LAB — HIV ANTIBODY (ROUTINE TESTING W REFLEX): HIV Screen 4th Generation wRfx: NONREACTIVE

## 2021-08-23 ENCOUNTER — Ambulatory Visit (INDEPENDENT_AMBULATORY_CARE_PROVIDER_SITE_OTHER): Payer: Medicaid Other | Admitting: Family Medicine

## 2021-08-23 ENCOUNTER — Other Ambulatory Visit: Payer: Self-pay

## 2021-08-23 VITALS — BP 117/78 | HR 81 | Ht 59.0 in | Wt 173.5 lb

## 2021-08-23 DIAGNOSIS — G44201 Tension-type headache, unspecified, intractable: Secondary | ICD-10-CM

## 2021-08-23 DIAGNOSIS — G44209 Tension-type headache, unspecified, not intractable: Secondary | ICD-10-CM | POA: Diagnosis not present

## 2021-08-23 MED ORDER — DICLOFENAC SODIUM 75 MG PO TBEC: 75.0000 mg | DELAYED_RELEASE_TABLET | Freq: Two times a day (BID) | ORAL | 0 refills | Status: DC

## 2021-08-23 NOTE — Patient Instructions (Addendum)
I believe this is a type f Tension-type headache with tender points in the muscles of your neck that are triggering the headaches.  Please start taking the diclofenac pain pill twice a day with food for next 5 days.  Stretch your neck muscles 4 to 5 times a day 5 times each time.    Use heat or ice on painful areas of head.    If not better by Tuesday, please let us know.

## 2021-08-23 NOTE — Progress Notes (Signed)
[name] is alone Sources of clinical information for visit is/are patient and past medical records. Nursing assessment for this office visit was reviewed with the patient for accuracy and revision.     Previous Report(s) Reviewed: none  Depression screen PHQ 2/9 08/23/2021  Decreased Interest 0  Down, Depressed, Hopeless 1  PHQ - 2 Score 1  Altered sleeping 0  Tired, decreased energy 1  Change in appetite 0  Feeling bad or failure about yourself  0  Trouble concentrating 0  Moving slowly or fidgety/restless 0  Suicidal thoughts 0  PHQ-9 Score 2  Difficult doing work/chores Not difficult at all  Some recent data might be hidden   [employer] Visit from 08/23/2021 in [place] Office Visit from 04/17/2021 in [place] Office Visit from 02/01/2021 in [place]  Thoughts that you would be better off dead, or of hurting yourself in some way Not at all Not at all Not at all  PHQ-9 Total Score 2 2 1        Fall Risk  04/17/2021 01/30/2021 10/08/2019 01/19/2019 12/28/2018  Falls in the past year? 0 0 0 0 0  Number falls in past yr: 0 0 0 0 -  Injury with Fall? 0 0 - - -  Follow up - - Falls evaluation completed Falls evaluation completed -    PHQ9 SCORE ONLY 08/23/2021 04/17/2021 02/01/2021  PHQ-9 Total Score 2 2 1     Adult vaccines due  Topic Date Due   TETANUS/TDAP  07/22/2026    Health Maintenance Due  Topic Date Due   COVID-19 Vaccine (3 - Booster for Moderna series) 02/26/2020      History/P.E. limitations: none  Adult vaccines due  Topic Date Due   TETANUS/TDAP  07/22/2026   There are no preventive care reminders to display for this patient.  Health Maintenance Due  Topic Date Due   COVID-19 Vaccine (3 - Booster for Moderna series) 02/26/2020     Chief Complaint  Patient presents with   Headache    HEADACHE   Onset: 3 days ago   Location: moved from right temple to left  periorbital to left occipital/post neck  Quality: throbbing Severity: moderate Interference with usual activities: no Similarity to prior headaches: yes Precipitating factors: none Frequency & Type of analgesic use & duration: APAP, headache responds but returns   Prior Diagnostic work-up: none   Associated Symptoms Nausea/vomiting: no  Photophobia/phonophobia: no  Change in vision: no Change in speech: no Numbness or weakness in limb: no Tearing of eyes: no  Sinus pain/pressure: no  Personal stressors: no     Red Flags Morning Headache: no Headache with coughing / sneezing / lifting: no Fever: no  Neck pain/stiffness: no Diplopia.no  Trauma: no  New type of headache: no  Anticoagulant use: no  H/o cancer/HIV/Pregnancy: no  Physical Exam VS: reviewed GEN: alert, cooperative, and no distress HEENT: Eye: negative findings: pupils equal, round, reactive to light and accomodation and optic nerve appearance unremarkable Neck / Thyroid: TTP right and left scalene muscles COR: Heart sounds are normal.  Regular rate and rhythm without murmur, gallop or rub. LUNG: clear to auscultation bilaterally ABD: normal appearance, normal bowel sounds, nontender to palpation,, no palpable masses, No HSM NEURO: alert, oriented, normal speech, no focal findings or movement disorder noted, neck supple without rigidity, Romberg sign negative, normal gait and station PSYCH: Good insight/prosodic and clear speech/Language concrete and relevant/affect  euthymic

## 2021-08-24 ENCOUNTER — Encounter: Payer: Self-pay | Admitting: Family Medicine

## 2021-08-24 DIAGNOSIS — G44209 Tension-type headache, unspecified, not intractable: Secondary | ICD-10-CM | POA: Insufficient documentation

## 2021-08-24 NOTE — Assessment & Plan Note (Signed)
New problem, recurrence Trial schedule diclofenac 75 mg twice a day for 5 days Posterior neck stretching exercises

## 2021-09-13 ENCOUNTER — Other Ambulatory Visit: Payer: Self-pay

## 2021-09-13 ENCOUNTER — Other Ambulatory Visit: Payer: Self-pay | Admitting: Family Medicine

## 2021-09-13 ENCOUNTER — Ambulatory Visit
Admission: RE | Admit: 2021-09-13 | Discharge: 2021-09-13 | Disposition: A | Discharge status: Home / Self Care | Payer: Medicaid Other | Source: Ambulatory Visit | Attending: Podiatry | Admitting: Podiatry

## 2021-09-13 DIAGNOSIS — Z1231 Encounter for screening mammogram for malignant neoplasm of breast: Secondary | ICD-10-CM | POA: Diagnosis not present

## 2021-10-24 ENCOUNTER — Other Ambulatory Visit: Payer: Self-pay

## 2021-10-24 ENCOUNTER — Ambulatory Visit (INDEPENDENT_AMBULATORY_CARE_PROVIDER_SITE_OTHER): Payer: Medicaid Other | Admitting: Obstetrics and Gynecology

## 2021-10-24 VITALS — Wt 172.1 lb

## 2021-10-24 DIAGNOSIS — Z975 Presence of (intrauterine) contraceptive device: Secondary | ICD-10-CM

## 2021-11-16 ENCOUNTER — Encounter: Payer: Self-pay | Admitting: Family Medicine

## 2021-11-16 ENCOUNTER — Other Ambulatory Visit: Payer: Self-pay

## 2021-11-16 ENCOUNTER — Ambulatory Visit (INDEPENDENT_AMBULATORY_CARE_PROVIDER_SITE_OTHER): Payer: Medicaid Other | Admitting: Family Medicine

## 2021-11-16 VITALS — BP 126/92 | HR 79 | Ht <= 58 in | Wt 170.2 lb

## 2021-11-16 DIAGNOSIS — Z30433 Encounter for removal and reinsertion of intrauterine contraceptive device: Secondary | ICD-10-CM

## 2021-11-16 DIAGNOSIS — Z30432 Encounter for removal of intrauterine contraceptive device: Secondary | ICD-10-CM

## 2021-11-16 DIAGNOSIS — Z5941 Food insecurity: Secondary | ICD-10-CM

## 2021-11-16 DIAGNOSIS — T8332XD Displacement of intrauterine contraceptive device, subsequent encounter: Secondary | ICD-10-CM

## 2021-11-16 NOTE — Progress Notes (Signed)
? ? ?  GYNECOLOGY OFFICE PROCEDURE NOTE ? ?[name] is a 46 y.o. G1P1001 here for [place] IUD removal. Seen by [facility] and no strings visualized but per their report IUD visible on bedside US. No GYN concerns.  Last pap smear: ? ?Lab Results  ?Component Value Date  ? DIAGPAP  11/29/2019  ?  - Negative for intraepithelial lesion or malignancy (NILM)  ? I[place] Negative 11/29/2019  ? ? ?IUD Removal  ?Patient identified, informed consent performed, consent signed.  Patient was in the dorsal lithotomy position, normal external genitalia was noted.  A speculum was placed in the patient's vagina, normal discharge was noted, no lesions. The cervix was visualized, no lesions, no abnormal discharge, nulliparous apppearance. Tenaculum placed.   ? ?Made multiple passes with IUD hook to the fundus, no definite IUD felt, none able to be extracted. Offered patient option of placing IUD and maybe having two present vs getting [place] to confirm presence or absence and then pursuing hysteroscopy/removal in OR with reinsertion vs simple office reinsertion if it is not present at all. She prefers the latter option. Procedure abandoned, patient given ibuprofen and pelvic US ordered. Patient took home her Mirena IUD that she had previously obtained through insurance.  ? ? ?Clarnce Flock, MD/MPH ?Attending Family Medicine Physician, Faculty Practice ?[facility] ? ?

## 2021-12-19 ENCOUNTER — Ambulatory Visit: Admission: RE | Admit: 2021-12-19 | Payer: Medicaid Other | Source: Ambulatory Visit

## 2021-12-24 ENCOUNTER — Ambulatory Visit
Admission: RE | Admit: 2021-12-24 | Discharge: 2021-12-24 | Disposition: A | Discharge status: Home / Self Care | Payer: Medicaid Other | Source: Ambulatory Visit | Attending: Family Medicine | Admitting: Family Medicine

## 2021-12-24 ENCOUNTER — Ambulatory Visit (INDEPENDENT_AMBULATORY_CARE_PROVIDER_SITE_OTHER): Payer: Medicaid Other | Admitting: Family Medicine

## 2021-12-24 ENCOUNTER — Encounter: Payer: Self-pay | Admitting: Family Medicine

## 2021-12-24 VITALS — BP 130/70 | HR 65 | Temp 98.4°F | Ht <= 58 in | Wt 174.6 lb

## 2021-12-24 DIAGNOSIS — T8332XD Displacement of intrauterine contraceptive device, subsequent encounter: Secondary | ICD-10-CM | POA: Insufficient documentation

## 2021-12-24 DIAGNOSIS — R400 Somnolence: Secondary | ICD-10-CM | POA: Diagnosis not present

## 2021-12-24 DIAGNOSIS — D252 Subserosal leiomyoma of uterus: Secondary | ICD-10-CM | POA: Diagnosis not present

## 2021-12-24 DIAGNOSIS — R0683 Snoring: Secondary | ICD-10-CM | POA: Insufficient documentation

## 2021-12-24 NOTE — Patient Instructions (Addendum)
It was great to see you! ? ?Things we discussed at today's visit: ?- I have ordered a sleep study. They will call you to schedule. ?-Please schedule an appointment in 3-4 weeks for an annual physical so we can check blood work and discuss screening items such as colonoscopy. ? ?Take care and seek immediate care sooner if you develop any concerns. ? ?Dr. Rock Nephew ?[facility]  ?

## 2021-12-24 NOTE — Progress Notes (Signed)
    SUBJECTIVE:   CHIEF COMPLAINT / HPI:   Discuss snoring Been told she snores like a freight train Longstanding issue- she thinks present for the past 2-3 years Endorses daytime sleepiness Naps daily if she's able to Has fallen asleep without trying during the daytime before No witnessed apnea No known hx of sleep apnea  PERTINENT  PMH / PSH: elevated BMI  OBJECTIVE:   BP 130/70   Pulse 65   Temp 98.4 F (36.9 C)   Ht '4\' 8"'$  (1.422 m)   Wt 174 lb 9.6 oz (79.2 kg)   SpO2 98%   BMI 39.14 kg/m   General: NAD, pleasant, able to participate in exam HEENT: no tonsillar edema, nares patent bilaterally CV: RRR, normal S1/S2 Respiratory: No respiratory distress Skin: warm and dry, no rashes noted Psych: Normal affect and mood Neuro: grossly intact   ASSESSMENT/PLAN:   Snoring STOP-BANG score of 3 (loud snoring, daytime sleepiness, BMI >35) correlating to intermediate risk of moderate to severe OSA. -Sleep study ordered -Patient has annual physical on 01/15/22- will discuss weight management at that time     Alcus Dad, MD [place]

## 2021-12-24 NOTE — Assessment & Plan Note (Signed)
STOP-BANG score of 3 (loud snoring, daytime sleepiness, BMI >35) correlating to intermediate risk of moderate to severe OSA. ?-Sleep study ordered ?-Patient has annual physical on 01/15/22- will discuss weight management at that time ?

## 2021-12-26 ENCOUNTER — Telehealth: Payer: Self-pay | Admitting: Lactation Services

## 2021-12-26 NOTE — Telephone Encounter (Signed)
-----   Message from Clarnce Flock, MD sent at 12/26/2021  1:00 PM EDT ----- ?Attempted to call patient, no answer, left VM saying I would send her a mychart message and have clinical pool follow up later. ? ?US obtained due to inability to remove IUD with hook in office in March. [place] confirms presence of an IUD, at this point if patient still desires removal will need to have hysteroscopy in OR. Clinical pool please call patient and let her know results and treatment plan, if she desires OR removal I will coordinate this with scheduler.  ?

## 2021-12-26 NOTE — Telephone Encounter (Signed)
Called and spoke with patient to inform her that IUD is in the uterus. Reviewed that to remove to IUD and replace it she will need to have a Hysteroscopy in the OR.  ? ?She was surprised that she would need to have surgery to remove. Explained Hysteroscopy briefly. Offered to send information in My Chart. She says she cannot get into Mychart. Offered to mail her information and she reports she does not wish it to be mailed and would like information to be left at front desk to pick up.  ? ?Patient was informed it would be in the OR and she would be asleep for the procedure and one of the providers in the office would perform.  ? ?Reviewed with patient that once she makes a decision, she needs to call the office to let us know so it can be scheduled. Patient voiced understanding.  ? ?Information left at front desk for patient to pick up.  ?

## 2021-12-26 NOTE — Progress Notes (Signed)
Attempted to call patient, no answer, left VM saying I would send her a mychart message and have clinical pool follow up later. ? ?US obtained due to inability to remove IUD with hook in office in March. [place] confirms presence of an IUD, at this point if patient still desires removal will need to have hysteroscopy in OR. Clinical pool please call patient and let her know results and treatment plan, if she desires OR removal I will coordinate this with scheduler.

## 2022-01-15 ENCOUNTER — Encounter: Payer: Self-pay | Admitting: Family Medicine

## 2022-01-15 ENCOUNTER — Ambulatory Visit (INDEPENDENT_AMBULATORY_CARE_PROVIDER_SITE_OTHER): Payer: Medicaid Other | Admitting: Family Medicine

## 2022-01-15 VITALS — BP 128/68 | HR 80 | Ht <= 58 in | Wt 174.0 lb

## 2022-01-15 DIAGNOSIS — Z1211 Encounter for screening for malignant neoplasm of colon: Secondary | ICD-10-CM | POA: Diagnosis not present

## 2022-01-15 DIAGNOSIS — R030 Elevated blood-pressure reading, without diagnosis of hypertension: Secondary | ICD-10-CM | POA: Diagnosis not present

## 2022-01-15 DIAGNOSIS — E669 Obesity, unspecified: Secondary | ICD-10-CM | POA: Diagnosis not present

## 2022-01-15 DIAGNOSIS — Z131 Encounter for screening for diabetes mellitus: Secondary | ICD-10-CM

## 2022-01-15 DIAGNOSIS — Z13220 Encounter for screening for lipoid disorders: Secondary | ICD-10-CM | POA: Diagnosis not present

## 2022-01-15 DIAGNOSIS — Z0000 Encounter for general adult medical examination without abnormal findings: Secondary | ICD-10-CM

## 2022-01-15 NOTE — Patient Instructions (Addendum)
Today at your annual preventive visit we talked about the following measures:   I recommend 150 minutes of exercise per week-try 30 minutes 5 days per week We discussed reducing sugary beverages (like soda and juice) and increasing leafy greens and whole fruits.  We discussed avoiding tobacco and alcohol.  I recommend avoiding illicit substances.  Your blood pressure was normal today but we will keep an eye on this at future visits.  Please discuss IUD removal and possibly getting your tubes tied with your OBGYN doctor.  We are checking your cholesterol and screening you for diabetes. I will send you a Mychart message with these results.  You are due for a colonoscopy (screening test for colon cancer). I have placed a referral to the gastroenterologist's office. They should call you to schedule an appointment. If you do not hear from them in 2-3 weeks, please let me know.  Take care, Dr Rock Nephew

## 2022-01-15 NOTE — Progress Notes (Unsigned)
    SUBJECTIVE:   Chief compliant/HPI: annual examination  [name] is a 46 y.o. who presents today for an annual exam.   History tabs reviewed and updated ***.   Review of systems form reviewed and notable for ***.   OBJECTIVE:   BP 140/68   Pulse 80   Ht $R'4\' 8"'ke$  (1.422 m)   Wt 174 lb (78.9 kg)   BMI 39.01 kg/m   ***  ASSESSMENT/PLAN:   No problem-specific Assessment & Plan notes found for this encounter.    Annual Examination  See AVS for age appropriate recommendations.   PHQ score ***, reviewed and discussed.  Blood pressure reviewed and at goal ***.  Asked about intimate partner violence and resources given as appropriate  The patient currently uses *** for contraception. Folate recommended as appropriate, minimum of 400 mcg per day.   Considered the following items based upon USPSTF recommendations: Diabetes screening: ordered Screening for elevated cholesterol: ordered HIV testing:  nonreactive on 04/17/21-  Hepatitis C:  normal on 02/01/21-  Hepatitis B: {discussed/ordered:14545} Syphilis if at high risk:  not indicated GC/CT not at high risk and not ordered.  Reviewed risk factors for osteoporosis. Using FRAX tool estimated risk of major osteoporotic fracture of  ***%, early screening {ordered not order:23822::"not ordered"}   Discussed family history, BRCA testing {not indicated/requested/declined:14582}. Tool used to risk stratify was ***.  Cervical cancer screening: prior Pap reviewed, repeat due in 2024 Breast cancer screening: {mammoscreen:23820} Colorectal cancer screening: {crcscreen:23821::"discussed, colonoscopy ordered"} if age 50 or over.   Follow up in 1 *** year or sooner if indicated.    Alcus Dad, MD [place]

## 2022-01-16 ENCOUNTER — Encounter: Payer: Self-pay | Admitting: Family Medicine

## 2022-01-16 DIAGNOSIS — R7303 Prediabetes: Secondary | ICD-10-CM | POA: Insufficient documentation

## 2022-01-16 LAB — HEMOGLOBIN A1C
Est. average glucose Bld gHb Est-mCnc: 120 mg/dL
Hgb A1c MFr Bld: 5.8 % — ABNORMAL HIGH (ref 4.8–5.6)

## 2022-01-16 LAB — LIPID PANEL
Chol/HDL Ratio: 2.9 ratio (ref 0.0–4.4)
Cholesterol, Total: 164 mg/dL (ref 100–199)
HDL: 57 mg/dL (ref >39)
LDL Chol Calc (NIH): 93 mg/dL (ref 0–99)
Triglycerides: 72 mg/dL (ref 0–149)
VLDL Cholesterol Cal: 14 mg/dL (ref 5–40)

## 2022-01-29 ENCOUNTER — Encounter: Payer: Self-pay | Admitting: *Deleted

## 2022-02-11 ENCOUNTER — Ambulatory Visit (HOSPITAL_BASED_OUTPATIENT_CLINIC_OR_DEPARTMENT_OTHER): Payer: Medicaid Other | Attending: Family Medicine | Admitting: Internal Medicine

## 2022-02-11 VITALS — Ht <= 58 in | Wt 178.0 lb

## 2022-02-11 DIAGNOSIS — R0683 Snoring: Secondary | ICD-10-CM | POA: Diagnosis present

## 2022-02-11 DIAGNOSIS — R400 Somnolence: Secondary | ICD-10-CM | POA: Insufficient documentation

## 2022-02-20 DIAGNOSIS — R0683 Snoring: Secondary | ICD-10-CM | POA: Diagnosis not present

## 2022-02-20 NOTE — Procedures (Signed)
    Patient Name: [name], [name] Date: 02/11/2022 Gender: Female D.O.B: 1976/04/20 Age (years): 46 Referring Provider: Leeanne Rio Height (inches): 56 Interpreting Physician: Baird Lyons MD, ABSM Weight (lbs): 178 RPSGT: Gwenyth Allegra BMI: 40 MRN: [id number] Neck Size: 14.00  CLINICAL INFORMATION Sleep Study Type: NPSG Indication for sleep study: Snoring Epworth Sleepiness Score: 15  SLEEP STUDY TECHNIQUE As per the AASM Manual for the Scoring of Sleep and Associated Events v2.3 (April 2016) with a hypopnea requiring 4% desaturations.  The channels recorded and monitored were frontal, central and occipital EEG, electrooculogram (EOG), submentalis EMG (chin), nasal and oral airflow, thoracic and abdominal wall motion, anterior tibialis EMG, snore microphone, electrocardiogram, and pulse oximetry.  MEDICATIONS Medications self-administered by patient taken the night of the study : none reported  SLEEP ARCHITECTURE The study was initiated at 11:35:16 PM and ended at 5:43:02 AM.  Sleep onset time was 14.7 minutes and the sleep efficiency was 90.7%%. The total sleep time was 333.5 minutes.  Stage REM latency was 85.5 minutes.  The patient spent 4.6%% of the night in stage N1 sleep, 72.0%% in stage N2 sleep, 0.0%% in stage N3 and 23.4% in REM.  Alpha intrusion was absent.  Supine sleep was 0.00%.  RESPIRATORY PARAMETERS The overall apnea/hypopnea index (AHI) was 2.3 per hour. There were 0 total apneas, including 0 obstructive, 0 central and 0 mixed apneas. There were 13 hypopneas and 0 RERAs.  The AHI during Stage REM sleep was 10.0 per hour.  AHI while supine was N/A per hour.  The mean oxygen saturation was 95.4%. The minimum SpO2 during sleep was 82.0%.  moderate snoring was noted during this study.  CARDIAC DATA The 2 lead EKG demonstrated sinus rhythm. The mean heart rate was 78.4 beats per minute. Other EKG findings include: None.  LEG MOVEMENT  DATA The total PLMS were 0 with a resulting PLMS index of 0.0. Associated arousal with leg movement index was 0.0 .  IMPRESSIONS - Occasional apneas and hypopneas, within normal limits occurred during this study (AHI = 2.3/h). - Mild oxygen desaturation was noted during this study (Min O2 = 82.0%). Mean 95.4% - The patient snored with moderate snoring volume. - No cardiac abnormalities were noted during this study. - Clinically significant periodic limb movements did not occur during sleep. No significant associated arousals.  DIAGNOSIS - Primary Snoring  RECOMMENDATIONS - Manage for snoring and symptoms based on clinical judgment. - Be careful with alcohol, sedatives and other CNS depressants that may worsen sleep apnea and disrupt normal sleep architecture. - Sleep hygiene should be reviewed to assess factors that may improve sleep quality. - Weight management and regular exercise should be initiated or continued if appropriate.  [Electronically signed] 02/20/2022 01:51 PM  Baird Lyons MD, [place], American Board of Sleep Medicine NPI: [id number]                         [place], [place] [facility]  ELECTRONICALLY SIGNED ON:  02/20/2022, 1:47 PM [place] PH: [phone]   FX: [phone] [place]

## 2022-03-06 ENCOUNTER — Telehealth: Payer: Self-pay | Admitting: Family Medicine

## 2022-03-06 NOTE — Telephone Encounter (Signed)
Patient informed of sleep study results which did not show any clinically significant sleep apnea. Diagnosis of primary snoring noted.   Patient would be interested in discussing further with ENT, who she already sees for hearing issues.   Alcus Dad, MD PGY-3, [place]

## 2022-04-08 ENCOUNTER — Ambulatory Visit (INDEPENDENT_AMBULATORY_CARE_PROVIDER_SITE_OTHER): Payer: Medicaid Other | Admitting: Family Medicine

## 2022-04-08 ENCOUNTER — Encounter: Payer: Self-pay | Admitting: Family Medicine

## 2022-04-08 VITALS — BP 146/87 | HR 80 | Ht <= 58 in | Wt 173.2 lb

## 2022-04-08 DIAGNOSIS — R030 Elevated blood-pressure reading, without diagnosis of hypertension: Secondary | ICD-10-CM

## 2022-04-08 DIAGNOSIS — R1084 Generalized abdominal pain: Secondary | ICD-10-CM | POA: Diagnosis present

## 2022-04-08 LAB — POCT UA - MICROSCOPIC ONLY: WBC, Ur, HPF, POC: NONE SEEN (ref 0–5)

## 2022-04-08 LAB — POCT URINALYSIS DIP (MANUAL ENTRY)
Bilirubin, UA: NEGATIVE
Glucose, UA: NEGATIVE mg/dL
Ketones, POC UA: NEGATIVE mg/dL
Leukocytes, UA: NEGATIVE
Nitrite, UA: NEGATIVE
Protein Ur, POC: NEGATIVE mg/dL
Spec Grav, UA: 1.02 (ref 1.010–1.025)
Urobilinogen, UA: 4 E.U./dL — AB
pH, UA: 7 (ref 5.0–8.0)

## 2022-04-08 MED ORDER — POLYETHYLENE GLYCOL 3350 17 GM/SCOOP PO POWD: 17.0000 g | Freq: Two times a day (BID) | ORAL | 1 refills | Status: AC | PRN

## 2022-04-08 NOTE — Progress Notes (Addendum)
    SUBJECTIVE:   CHIEF COMPLAINT / HPI:   Stomach pain/bloating - Started on Saturday - Goes all over her abdomen (originates on lower left side) - Pain is more intense than a throb but is not stabbing pain - Will go away if she is still  - Pooping less than usual, doesn't feel gaseous - Has taken Ibuprofen without improvement - Hasn't taken Miralax since Saturday morning - Having to strain on the toilet, has smaller thin ones that come through - Denies nausea or vomiting  PERTINENT  PMH / PSH: Reviewed  OBJECTIVE:   BP (!) 146/87   Pulse 80   Ht '4\' 8"'$  (1.422 m)   Wt 173 lb 3.2 oz (78.6 kg)   SpO2 99%   BMI 38.83 kg/m   General: NAD, well-appearing, well-nourished Respiratory: No respiratory distress, breathing comfortably, able to speak in full sentences Skin: warm and dry, no rashes noted on exposed skin Abdomen: Lower quadrants tender to palpation, no masses appreciated.  Has some rebound tenderness, no other peritoneal signs.  Discomfort present in multiple areas when palpating the lower abdomen including the legs and back.  ASSESSMENT/PLAN:   Abdominal pain, generalized lower quadrants Patient with generalized lower abdominal pain that radiates into the legs and sometimes into the side/back.  Unclear etiology at this time, do not feel that this is an acute abdomen that requires emergent treatment.  IUD was visualized in the uterus in May, unlikely perforation at this time (discussed with Dr. Erin Hearing).  Given abnormal pattern in bowel movements most recently, will need to rule out constipation with treatment.  Do not think that there is an impaction given patient's descriptions of symptoms.  Unlikely to have intra-abdominal infection and no nausea or vomiting that would suggest an ileus.  Urine does only show increased urobilinogen, unclear etiology or if this is an abnormal read on the urine dipstick-would follow-up need to be seen again. - Recommend trial of MiraLAX  cleanout, can increase to 2 capfuls twice a day as needed - Follow-up in 2 to 3 days if no symptom improvement after using MiraLAX - ER and return precautions given - Repeat urine at next visit and consider LFTs  Elevated blood pressure reading Likely secondary to abdominal pain, recommend continuing to monitor with visits. - Follow-up at next visit  Rise Patience, [place]

## 2022-04-08 NOTE — Patient Instructions (Signed)
The first thing we urinated rule out is to make sure this is not something related to constipation.  I do want you to try and do suppositories (may need to consider an enema at some point).   If the pain is continuing or getting worse over the next few days, you start to have difficulty with eating or drinking, nausea/vomiting, uncontrolled diarrhea or blood in your stool, issues urinating, then I need you to go to the ER emergently.  Make sure to stay hydrated with plenty of water, lets try and treat this at first visit if it is constipation and go from there with treatment.  I would also try Gas-X to see if that helps anything.  I want you to call [facility] to see about getting the colonoscopy scheduled as well.  Their phone number is [phone]

## 2022-04-09 ENCOUNTER — Encounter (HOSPITAL_BASED_OUTPATIENT_CLINIC_OR_DEPARTMENT_OTHER): Payer: Self-pay | Admitting: Emergency Medicine

## 2022-04-09 ENCOUNTER — Other Ambulatory Visit: Payer: Self-pay

## 2022-04-09 ENCOUNTER — Telehealth: Payer: Self-pay

## 2022-04-09 DIAGNOSIS — D259 Leiomyoma of uterus, unspecified: Secondary | ICD-10-CM | POA: Insufficient documentation

## 2022-04-09 DIAGNOSIS — K5900 Constipation, unspecified: Secondary | ICD-10-CM | POA: Insufficient documentation

## 2022-04-09 DIAGNOSIS — R1031 Right lower quadrant pain: Secondary | ICD-10-CM | POA: Diagnosis not present

## 2022-04-09 LAB — CBC
HCT: 37.9 % (ref 36.0–46.0)
Hemoglobin: 12.1 g/dL (ref 12.0–15.0)
MCH: 27.9 pg (ref 26.0–34.0)
MCHC: 31.9 g/dL (ref 30.0–36.0)
MCV: 87.5 fL (ref 80.0–100.0)
Platelets: 376 10*3/uL (ref 150–400)
RBC: 4.33 MIL/uL (ref 3.87–5.11)
RDW: 12.8 % (ref 11.5–15.5)
WBC: 14.5 10*3/uL — ABNORMAL HIGH (ref 4.0–10.5)
nRBC: 0 % (ref 0.0–0.2)

## 2022-04-09 NOTE — ED Triage Notes (Signed)
Started Sunday, Pain in lower abdominal area, does shoot down leg into thighs and right buttocks. Ibuprofen with no relief. Denies constipation (took laxative) denies urinary symptoms.  intermittent

## 2022-04-09 NOTE — Telephone Encounter (Signed)
Patient calls nurse line regarding continued abdominal pain. She reports that she took a magnesium laxative last night and miralax this morning. She has had several soft stools since taking these medications. Denies blood in stool.   She is currently rating her pain at 7/10. She denies any changes in eating habits and has been drinking plenty of fluids. Denies vomiting, fever of issues with urination.   Scheduled patient for next available appointment on Thursday afternoon.   Discussed red flags and ED precautions.   Forwarding to Dr .Oleh Genin for recommendations until this appointment.   Talbot Grumbling, RN

## 2022-04-10 ENCOUNTER — Emergency Department (HOSPITAL_BASED_OUTPATIENT_CLINIC_OR_DEPARTMENT_OTHER): Payer: Medicaid Other

## 2022-04-10 ENCOUNTER — Emergency Department (HOSPITAL_BASED_OUTPATIENT_CLINIC_OR_DEPARTMENT_OTHER)
Admission: EM | Admit: 2022-04-10 | Discharge: 2022-04-10 | Disposition: A | Discharge status: Home / Self Care | Payer: Medicaid Other | Attending: Emergency Medicine | Admitting: Emergency Medicine

## 2022-04-10 DIAGNOSIS — R1030 Lower abdominal pain, unspecified: Secondary | ICD-10-CM

## 2022-04-10 DIAGNOSIS — D259 Leiomyoma of uterus, unspecified: Secondary | ICD-10-CM

## 2022-04-10 DIAGNOSIS — R1031 Right lower quadrant pain: Secondary | ICD-10-CM | POA: Diagnosis not present

## 2022-04-10 LAB — URINALYSIS, ROUTINE W REFLEX MICROSCOPIC
Bilirubin Urine: NEGATIVE
Glucose, UA: NEGATIVE mg/dL
Ketones, ur: NEGATIVE mg/dL
Leukocytes,Ua: NEGATIVE
Nitrite: NEGATIVE
Specific Gravity, Urine: 1.029 (ref 1.005–1.030)
pH: 6 (ref 5.0–8.0)

## 2022-04-10 LAB — COMPREHENSIVE METABOLIC PANEL
ALT: 20 U/L (ref 0–44)
AST: 13 U/L — ABNORMAL LOW (ref 15–41)
Albumin: 4.2 g/dL (ref 3.5–5.0)
Alkaline Phosphatase: 38 U/L (ref 38–126)
Anion gap: 8 (ref 5–15)
BUN: 8 mg/dL (ref 6–20)
CO2: 28 mmol/L (ref 22–32)
Calcium: 10 mg/dL (ref 8.9–10.3)
Chloride: 98 mmol/L (ref 98–111)
Creatinine, Ser: 0.83 mg/dL (ref 0.44–1.00)
GFR, Estimated: >60 mL/min (ref >60)
Glucose, Bld: 119 mg/dL — ABNORMAL HIGH (ref 70–99)
Potassium: 3.7 mmol/L (ref 3.5–5.1)
Sodium: 134 mmol/L — ABNORMAL LOW (ref 135–145)
Total Bilirubin: 0.5 mg/dL (ref 0.3–1.2)
Total Protein: 7.5 g/dL (ref 6.5–8.1)

## 2022-04-10 LAB — PREGNANCY, URINE: Preg Test, Ur: NEGATIVE

## 2022-04-10 LAB — LIPASE, BLOOD: Lipase: 30 U/L (ref 11–51)

## 2022-04-10 MED ORDER — SODIUM CHLORIDE 0.9 % IV BOLUS
1000.0000 mL | Freq: Once | INTRAVENOUS | Status: AC
  Administered 2022-04-10: 1000 mL via INTRAVENOUS

## 2022-04-10 MED ORDER — FENTANYL CITRATE PF 50 MCG/ML IJ SOSY
50.0000 ug | PREFILLED_SYRINGE | Freq: Once | INTRAMUSCULAR | Status: AC
  Administered 2022-04-10: 50 ug via INTRAVENOUS
  Filled 2022-04-10: qty 1

## 2022-04-10 MED ORDER — IOHEXOL 300 MG/ML  SOLN
80.0000 mL | Freq: Once | INTRAMUSCULAR | Status: AC | PRN
  Administered 2022-04-10: 80 mL via INTRAVENOUS

## 2022-04-10 NOTE — ED Provider Notes (Signed)
[facility] EM[facility] DEPT Provider Note  CSN: [id number] Arrival date & time: 04/09/22 2306  Chief Complaint(s) Abdominal Pain  HPI [name] is a 46 y.o. female     Abdominal Pain Pain location:  Suprapubic, RLQ and LLQ Pain quality: aching and cramping   Pain radiates to:  R flank Pain severity:  Moderate Onset quality:  Gradual Duration:  3 days Timing:  Constant Progression:  Waxing and waning Chronicity:  New Relieved by:  Nothing Worsened by:  Nothing Associated symptoms: constipation and nausea   Associated symptoms: no diarrhea, no dysuria, no fatigue, no fever, no hematemesis, no hematochezia and no vomiting     Past Medical History Past Medical History:  Diagnosis Date   Hearing loss    Patient Active Problem List   Diagnosis Date Noted   Prediabetes 01/16/2022   Snoring 12/24/2021   Hemorrhoid 09/32/6712   IUD complication ([place]) 45/80/9983   Elevated blood pressure reading 02/01/2021   Mononeuropathy 11/10/2020   Hearing loss 07/22/2016   Obesity (BMI 30.0-34.9) 10/31/2011   Home Medication(s) Prior to Admission medications   Medication Sig Start Date End Date Taking? Authorizing Provider  levonorgestrel (MIRENA) 20 MCG/DAY IUD by Intrauterine route.    [provider]  polyethylene glycol powder (GLYCOLAX/MIRALAX) 17 GM/SCOOP powder Take 17 g by mouth 2 (two) times daily as needed. 04/08/22   Lilland, Lorrin Goodell, DO                                                                                                                                    Allergies Patient has no known allergies.  Review of Systems Review of Systems  Constitutional:  Negative for fatigue and fever.  Gastrointestinal:  Positive for abdominal pain, constipation and nausea. Negative for diarrhea, hematemesis, hematochezia and vomiting.  Genitourinary:  Negative for dysuria.   As noted in HPI  Physical Exam Vital Signs  I have reviewed the triage  vital signs BP 130/75   Pulse 81   Temp 99.9 F (37.7 C) (Oral)   Resp 17   SpO2 93%   Physical Exam Vitals reviewed.  Constitutional:      General: She is not in acute distress.    Appearance: She is well-developed. She is not diaphoretic.  HENT:     Head: Normocephalic and atraumatic.     Right Ear: External ear normal.     Left Ear: External ear normal.     Nose: Nose normal.  Eyes:     General: No scleral icterus.    Conjunctiva/sclera: Conjunctivae normal.  Neck:     Trachea: Phonation normal.  Cardiovascular:     Rate and Rhythm: Normal rate and regular rhythm.  Pulmonary:     Effort: Pulmonary effort is normal. No respiratory distress.     Breath sounds: No stridor.  Abdominal:     General: There is no distension.     Tenderness: There  is abdominal tenderness in the right lower quadrant and suprapubic area. There is no guarding or rebound.  Musculoskeletal:        General: Normal range of motion.     Cervical back: Normal range of motion.  Neurological:     Mental Status: She is alert and oriented to person, place, and time.  Psychiatric:        Behavior: Behavior normal.     ED Results and Treatments Labs (all labs ordered are listed, but only abnormal results are displayed) Labs Reviewed  COMPREHENSIVE METABOLIC PANEL - Abnormal; Notable for the following components:      Result Value   Sodium 134 (*)    Glucose, Bld 119 (*)    AST 13 (*)    All other components within normal limits  CBC - Abnormal; Notable for the following components:   WBC 14.5 (*)    All other components within normal limits  URINALYSIS, ROUTINE W REFLEX MICROSCOPIC - Abnormal; Notable for the following components:   Hgb urine dipstick TRACE (*)    Protein, ur TRACE (*)    All other components within normal limits  LIPASE, BLOOD  PREGNANCY, URINE                                                                                                                         EKG  EKG  Interpretation  Date/Time:    Ventricular Rate:    PR Interval:    QRS Duration:   QT Interval:    QTC Calculation:   R Axis:     Text Interpretation:         Radiology CT ABDOMEN PELVIS W CONTRAST  Result Date: 04/10/2022 CLINICAL DATA:  Right lower quadrant pain for 2 days, initial encounter EXAM: CT ABDOMEN AND PELVIS WITH CONTRAST TECHNIQUE: Multidetector CT imaging of the abdomen and pelvis was performed using the standard protocol following bolus administration of intravenous contrast. RADIATION DOSE REDUCTION: This exam was performed according to the departmental dose-optimization program which includes automated exposure control, adjustment of the mA and/or kV according to patient size and/or use of iterative reconstruction technique. CONTRAST:  38m OMNIPAQUE IOHEXOL 300 MG/ML  SOLN COMPARISON:  Pelvis ultrasound from 12/24/2021 FINDINGS: Lower chest: Lung bases are free of acute infiltrate or sizable effusion. Along the medial aspect of the left lung base some dystrophic calcification is noted likely benign in etiology. Hepatobiliary: No focal liver abnormality is seen. No gallstones, gallbladder wall thickening, or biliary dilatation. Pancreas: Unremarkable. No pancreatic ductal dilatation or surrounding inflammatory changes. Spleen: Normal in size without focal abnormality. Adrenals/Urinary Tract: Adrenal glands are within normal limits. Kidneys are well visualized within normal enhancement pattern. No renal calculi or obstructive changes are seen. The bladder is well distended. Stomach/Bowel: No obstructive or inflammatory changes of the colon are seen. The appendix is within normal limits. Small bowel and stomach unremarkable. Vascular/Lymphatic: No significant vascular findings are present. No enlarged abdominal or pelvic lymph nodes. Reproductive:  Uterus demonstrates multiple uterine fibroids. The largest of these measures approximately 5.3 cm and is hypodense in nature which may  represent some necrosis. Smaller uterine fibroids are seen. IUD is noted in place. Adnexa are within normal limits. Other: No abdominal wall hernia or abnormality. No abdominopelvic ascites. Musculoskeletal: No acute or significant osseous findings. IMPRESSION: Uterine fibroid change. Normal-appearing appendix. No acute abnormality noted Electronically Signed   By: Inez Catalina M.D.   On: 04/10/2022 02:28    Medications Ordered in ED Medications  fentaNYL (SUBLIMAZE) injection 50 mcg (50 mcg Intravenous Given 04/10/22 0148)  sodium chloride 0.9 % bolus 1,000 mL (1,000 mLs Intravenous New Bag/Given 04/10/22 0149)  iohexol (OMNIPAQUE) 300 MG/ML solution 80 mL (80 mLs Intravenous Contrast Given 04/10/22 0209)                                                                                                                                     Procedures Procedures  (including critical care time)  Medical Decision Making / ED Course   Medical Decision Making Amount and/or Complexity of Data Reviewed Labs: ordered. Radiology: ordered.  Risk Prescription drug management.    Patient presents with lower abdominal pain.  Tender to palpation. Will need to assess for pregnancy related process, UTI, intra-abdominal inflammatory/infectious process such as appendicitis.  CBC with leukocytosis.  No anemia. Metabolic panel without significant electrolyte derangement or renal sufficiency.  No evidence of bili obstruction or pancreatitis. UA without evidence of infection Pregnancy negative  CT scan negative for any acute intra-abdominal inflammatory/infectious process or bowel obstruction.  It did reveal numerous uterine fibroids with larger one having signs of possible necrosis.  Recommended close follow-up with gynecologist.  The patient appears reasonably screened and/or stabilized for discharge and I doubt any other medical condition or other [facility] requiring further screening, evaluation, or treatment  in the ED at this time. I have discussed the findings, Dx and Tx plan with the patient/family who expressed understanding and agree(s) with the plan. Discharge instructions discussed at length. The patient/family was given strict return precautions who verbalized understanding of the instructions. No further questions at time of discharge.  Disposition: Discharge  Condition: Good  ED Discharge Orders     None         Follow Up: Primary care provider  Call  to schedule an appointment for close follow up  Ob/Gyn  Call  to schedule an appointment for close follow up          Final Clinical Impression(s) / ED Diagnoses Final diagnoses:  Lower abdominal pain  Uterine leiomyoma, unspecified location           This chart was dictated using voice recognition software.  Despite best efforts to proofread,  errors can occur which can change the documentation meaning.    Fatima Blank, MD 04/10/22 [phone]

## 2022-04-10 NOTE — Telephone Encounter (Signed)
Patient calls back regarding this abdominal pain. Patient seen at ED yesterday regarding this and states they did not giver her any pain medicine and she wants someone at our office to prescribe her pain meds. Made patient aware of her appointment with [place] on Thursday(tomorrow) and patient states she wants to keep that appointment but needs pain meds now. Told patient our doctors most likely will not give meds without seeing patient first.

## 2022-04-10 NOTE — Telephone Encounter (Signed)
Called patient stating that we were not able to give pain medications over the phone and that she would need further evaluation.  Patient reports that what ever was given in the ED nothing she is doing at home right now are currently helping her pain is not as bad.  Does look like she had uterine fibroids that were of concern on the CT scan that was obtained last night.  Patient will likely follow-up with gynecologist, does appear that they saw faculty practice for an attempt at the IUD removal but do not know if they typically have her as an established patient.   Remijio Holleran, DO

## 2022-04-10 NOTE — ED Notes (Signed)
Pt verbalizes understanding of discharge instructions. Opportunity for questioning and answers were provided. Pt discharged from ED to home with family.    

## 2022-04-11 ENCOUNTER — Ambulatory Visit (INDEPENDENT_AMBULATORY_CARE_PROVIDER_SITE_OTHER): Payer: Medicaid Other | Admitting: Family Medicine

## 2022-04-11 VITALS — BP 112/62 | HR 72 | Wt 172.0 lb

## 2022-04-11 DIAGNOSIS — R319 Hematuria, unspecified: Secondary | ICD-10-CM

## 2022-04-11 DIAGNOSIS — D259 Leiomyoma of uterus, unspecified: Secondary | ICD-10-CM

## 2022-04-11 DIAGNOSIS — R1084 Generalized abdominal pain: Secondary | ICD-10-CM | POA: Diagnosis not present

## 2022-04-11 LAB — POCT URINALYSIS DIP (MANUAL ENTRY)
Bilirubin, UA: NEGATIVE
Glucose, UA: NEGATIVE mg/dL
Ketones, POC UA: NEGATIVE mg/dL
Leukocytes, UA: NEGATIVE
Nitrite, UA: NEGATIVE
Protein Ur, POC: NEGATIVE mg/dL
Spec Grav, UA: 1.01 (ref 1.010–1.025)
Urobilinogen, UA: 1 E.U./dL
pH, UA: 6.5 (ref 5.0–8.0)

## 2022-04-11 MED ORDER — IBUPROFEN 800 MG PO TABS: 800.0000 mg | ORAL_TABLET | Freq: Four times a day (QID) | ORAL | 0 refills | Status: DC | PRN

## 2022-04-11 NOTE — Progress Notes (Signed)
    SUBJECTIVE:   CHIEF COMPLAINT / HPI:   Abdominal pain Previously seen at [facility] 8/14 for generalized lower abdominal pain that had started suddenly two days prior.  At that time, thought to be due to constipation, recommended MiraLAX cleanout.  Labs collected.  CBC showed mild leukocytosis of 14.5.  UA shows trace hemoglobin, trace protein, negative for nitrites leukocytes.  Negative labs include CMP, lipase, and urine pregnancy.  8/15 called the nurse line to report continued abdominal pain despite magnesium laxative the night before and MiraLAX the morning of 8/15 with subsequent soft stools.  8/16 was seen at the emergency department.  CBC, CMP unchanged from [facility] labs 8/14.  Again, UA and pregnancy negative.  CT scan of abdomen and pelvis with contrast demonstrates no acute etiology but did  note changes in uterine fibroids.  There are multiple uterine fibroids, largest 5.3 cm and hypodense in nature (thought to represent some necrosis possibly).  IUD is noted to be in place, no signs of perforation (however I believe ultrasound is better at detecting perforation than CT).  Today, patient reports continued pain. Somewhat better. Was told to alternate ibuprofen and tylenol. She reports "taking a lot of pills" of ibuprofen and tylenol. Taking 1500 mg ibuprofen at a time, 1000 mg tylenol every 4 hours. Would like something for pain but nonopioid, like ibuprofen 800 mg.  Trace blood on previous UA Provider who saw her previously noted trace blood on UA 8/14.  Recommended recollection.  Patient denies any gross hematuria.  PERTINENT  PMH / PSH: Prediabetes, BMI 38, elevated blood pressure, hearing loss, snoring, hemorrhoids  OBJECTIVE:   BP 112/62   Pulse 72   Wt 172 lb (78 kg)   SpO2 99%   BMI 38.56 kg/m   PHQ-9:     04/11/2022    4:19 PM 04/08/2022    2:40 PM 01/15/2022    2:36 PM  Depression screen PHQ 2/9  Decreased Interest 0 0 0  Down, Depressed, Hopeless 0 0 0  PHQ - 2 Score 0  0 0  Altered sleeping 0 0 0  Tired, decreased energy 1 2 0  Change in appetite 0 0 0  Feeling bad or failure about yourself  0 0 0  Trouble concentrating 1 0 0  Moving slowly or fidgety/restless 0 0 0  Suicidal thoughts 0 0 0  PHQ-9 Score 2 2 0    Physical Exam General: Awake, alert, oriented, no acute distress Respiratory: Unlabored respirations, speaking in full sentences, no respiratory distress Extremities: Moving all extremities spontaneously Neuro: Cranial nerves II through X grossly intact Psych: Normal insight and judgement   ASSESSMENT/PLAN:   Uterine leiomyoma w/ necrotic changes on CT Acute onset abdominal pain likely due to multiple worsened uterine fibroids seen on CT scan in ED yesterday.  No red flag symptoms, no acute abdomen at this time.  We will place urgent referral to OB/GYN for evaluation, will likely need surgery.  Rx ibuprofen 800 mg, counseled on max doses of Tylenol in a day.  Completed FMLA paperwork.  See AVS for more, return precautions discussed.  Hematuria Trace blood on UA both today and 8/14 did not meet criteria for microscopic hematuria given there are not more than 3 RBCs per hpf.  No follow-up indicated.     Ezequiel Essex, MD [place]

## 2022-04-11 NOTE — Patient Instructions (Signed)
It was wonderful to meet you today. Thank you for allowing me to be a part of your care. Below is a short summary of what we discussed at your visit today:  Abdominal pain from fibroids I have placed an urgent referral to the OB/GYN office.  They should be reaching out to you soon to schedule an appointment.  At this appointment, you will speak with an OB/GYN physician about the fibroids and what are the options.   I have sent in a prescription for ibuprofen 800 mg.  Please take this up to every 6 hours as needed for pain.  Alternate with Tylenol.  The max amount of Tylenol you should have in a day total is 4 g (4000 mg).  If you do 2 of your 500 mg capsules (1000 mg) every 6 hours, this will get you to 4000 mg in 24 hours.  Use an alternating schedule for the pain medicines: Start with ibuprofen.  3 hours later, take Tylenol.  3 hours later take ibuprofen.  3 hours later, take Tylenol. So on an so forth.   I received your FMLA paperwork.  Once it is complete, I will place it in the front office and the nurses will notify you.    If you have any questions or concerns, please do not hesitate to contact us via phone or MyChart message.   Ezequiel Essex, MD

## 2022-04-12 DIAGNOSIS — D259 Leiomyoma of uterus, unspecified: Secondary | ICD-10-CM | POA: Insufficient documentation

## 2022-04-12 LAB — URINALYSIS, MICROSCOPIC ONLY: Casts: NONE SEEN /lpf

## 2022-04-12 NOTE — Assessment & Plan Note (Signed)
Acute onset abdominal pain likely due to multiple worsened uterine fibroids seen on CT scan in ED yesterday.  No red flag symptoms, no acute abdomen at this time.  We will place urgent referral to OB/GYN for evaluation, will likely need surgery.  Rx ibuprofen 800 mg, counseled on max doses of Tylenol in a day.  Completed FMLA paperwork.  See AVS for more, return precautions discussed.

## 2022-04-12 NOTE — Assessment & Plan Note (Signed)
Trace blood on UA both today and 8/14 did not meet criteria for microscopic hematuria given there are not more than 3 RBCs per hpf.  No follow-up indicated.

## 2022-04-15 ENCOUNTER — Telehealth: Payer: Self-pay

## 2022-04-15 NOTE — Telephone Encounter (Signed)
FMLA forms completed by provider and placed up front for pick up.   Copy was made for batch scanning.

## 2022-04-22 ENCOUNTER — Telehealth: Payer: Self-pay | Admitting: Family Medicine

## 2022-04-22 NOTE — Telephone Encounter (Signed)
Routing to referral coordinator to see about sending to someone else. Jaquana Geiger Zimmerman Rumple, CMA

## 2022-04-22 NOTE — Telephone Encounter (Signed)
Patient stated the referral to the OB-GYN is not taking anyone until December and she need another referral to another clinic.

## 2022-04-26 NOTE — Telephone Encounter (Signed)
Sent referral to [facility] [place] to see if they can get her scheduled before the end of the year.  Blossom Crume,CMA

## 2022-05-11 DIAGNOSIS — J019 Acute sinusitis, unspecified: Secondary | ICD-10-CM | POA: Diagnosis not present

## 2022-07-01 ENCOUNTER — Encounter: Payer: Self-pay | Admitting: Obstetrics and Gynecology

## 2022-07-01 ENCOUNTER — Ambulatory Visit (INDEPENDENT_AMBULATORY_CARE_PROVIDER_SITE_OTHER): Payer: Medicaid Other | Admitting: Obstetrics and Gynecology

## 2022-07-01 VITALS — BP 137/83 | HR 75 | Ht <= 58 in | Wt 172.2 lb

## 2022-07-01 DIAGNOSIS — R0683 Snoring: Secondary | ICD-10-CM | POA: Diagnosis not present

## 2022-07-01 DIAGNOSIS — D259 Leiomyoma of uterus, unspecified: Secondary | ICD-10-CM

## 2022-07-01 DIAGNOSIS — T8332XD Displacement of intrauterine contraceptive device, subsequent encounter: Secondary | ICD-10-CM

## 2022-07-01 DIAGNOSIS — R3129 Other microscopic hematuria: Secondary | ICD-10-CM

## 2022-07-01 DIAGNOSIS — Z3202 Encounter for pregnancy test, result negative: Secondary | ICD-10-CM

## 2022-07-01 LAB — URINALYSIS, ROUTINE W REFLEX MICROSCOPIC
Bilirubin, UA: NEGATIVE
Glucose, UA: NEGATIVE
Ketones, UA: NEGATIVE
Leukocytes,UA: NEGATIVE
Nitrite, UA: NEGATIVE
Protein,UA: NEGATIVE
RBC, UA: NEGATIVE
Specific Gravity, UA: 1.026 (ref 1.005–1.030)
Urobilinogen, Ur: 1 mg/dL (ref 0.2–1.0)
pH, UA: 6 (ref 5.0–7.5)

## 2022-07-01 LAB — POCT URINALYSIS DIP (DEVICE)
Bilirubin Urine: NEGATIVE
Glucose, UA: NEGATIVE mg/dL
Ketones, ur: NEGATIVE mg/dL
Leukocytes,Ua: NEGATIVE
Nitrite: NEGATIVE
Protein, ur: NEGATIVE mg/dL
Specific Gravity, Urine: >=1.03 (ref 1.005–1.030)
Urobilinogen, UA: 1 mg/dL (ref 0.0–1.0)
pH: 6 (ref 5.0–8.0)

## 2022-07-01 LAB — POCT PREGNANCY, URINE: Preg Test, Ur: NEGATIVE

## 2022-07-01 MED ORDER — KETOROLAC TROMETHAMINE 10 MG PO TABS: 10.0000 mg | ORAL_TABLET | Freq: Four times a day (QID) | ORAL | 1 refills | Status: DC | PRN

## 2022-07-01 NOTE — Progress Notes (Unsigned)
Results from CT scan on 04/10/22:  Reproductive: Uterus demonstrates multiple uterine fibroids. The largest of these measures approximately 5.3 cm and is hypodense in nature which may represent some necrosis. Smaller uterine fibroids are seen. IUD is noted in place. Adnexa are within normal limits.

## 2022-07-01 NOTE — Progress Notes (Unsigned)
Obstetrics and Gynecology New Patient Evaluation  Appointment Date: 07/01/2022  OBGYN Clinic: [facility]   Primary Care Provider: Alcus Dad  Referring Provider: Alcus Dad, MD  Chief Complaint: history of abdominal pain and fibroids   History of Present Illness: [name] is a 46 y.o. African-American G1P1001 (No LMP recorded. (Menstrual status: IUD).), seen for the above chief complaint. Her past medical history is significant for lost Mirena, fibroids, BMI 38, c-section x 1.   Patient initially referred by her PCP for abdominal pain in mid August. At that visit, she had new onset abdominal pain and bloating so a CT scan was ordered. She previously had an u/s to locate the lost IUD in may and it showed the IUD in the normal position in the uterus. CT scan still showed IUD in the appropriate uterine position but it also showed multiple fibroids and it appeared that a 5-5.5cm one was undergoing some necrosis, which an early may u/s, for locating the IUD, did not show evidence of.   She was referred to [facility] for further management.   She denies any pain after that episode. She denies any menopausal s/s.   Review of Systems: Pertinent items noted in HPI and remainder of comprehensive ROS otherwise negative.   Patient Active Problem List   Diagnosis Date Noted   Uterine leiomyoma w/ necrotic changes on CT 04/12/2022   Prediabetes 01/16/2022   Snoring 12/24/2021   Hemorrhoid 50/93/2671   IUD complication ([place]) 24/58/0998   Elevated blood pressure reading 02/01/2021   Mononeuropathy 11/10/2020   Hearing loss 07/22/2016   Obesity (BMI 30.0-34.9) 10/31/2011   Hematuria 03/20/2009    Past Medical History:  Past Medical History:  Diagnosis Date   Hearing loss    Prediabetes     Past Surgical History:  Past Surgical History:  Procedure Laterality Date   CESAREAN SECTION      Past Obstetrical History:  OB History  Gravida  Para Term Preterm AB Living  '1 1 1 '$ 0 0 1  SAB IAB Ectopic Multiple Live Births  0 0 0 0 1    # Outcome Date GA Lbr Len/2nd Weight Sex Delivery Anes PTL Lv  1 Term 10/27/01 [redacted]w[redacted]d 9 lb (4.082 kg) F CS-Unspec   LIV   Past Gynecological History: As per HPI. Periods: rarely spotting and cramping History of Pap Smear(s): Yes.   Last pap 2021, which was cytology and HPV negative Mirena has been in place since April 2015  Social History:  Social History   Socioeconomic History   Marital status: Single    Spouse name: Not on file   Number of children: Not on file   Years of education: Not on file   Highest education level: Not on file  Occupational History   Not on file  Tobacco Use   Smoking status: Never    Passive exposure: Never   Smokeless tobacco: Never  Vaping Use   Vaping Use: Never used  Substance and Sexual Activity   Alcohol use: Yes    Comment: occ   Drug use: No   Sexual activity: Yes    Birth control/protection: I.U.D.  Other Topics Concern   Not on file  Social History Narrative   Not on file   Social Determinants of Health   Financial Resource Strain: Not on file  Food Insecurity: Food Insecurity Present (11/16/2021)   Hunger Vital Sign    Worried About Running Out of Food in the  Last Year: Sometimes true    Ran Out of Food in the Last Year: Sometimes true  Transportation Needs: No Transportation Needs (11/16/2021)   PRAPARE - Hydrologist (Medical): No    Lack of Transportation (Non-Medical): No  Physical Activity: Not on file  Stress: Not on file  Social Connections: Not on file  Intimate Partner Violence: Not on file    Family History:  Family History  Problem Relation Age of Onset   Hypertension Mother    Diabetes Mother    CAD Mother    Breast cancer Other    Health Maintenance:  Mammogram(s): Yes.   Date: neg 08/2021  Medications [name] had no medications administered during this visit. Current  Outpatient Medications  Medication Sig Dispense Refill   levonorgestrel (MIRENA) 20 MCG/DAY IUD by Intrauterine route.     polyethylene glycol powder (GLYCOLAX/MIRALAX) 17 GM/SCOOP powder Take 17 g by mouth 2 (two) times daily as needed. 3350 g 1   No current facility-administered medications for this visit.   Allergies Patient has no known allergies.  Physical Exam:  BP 137/83   Pulse 75   Ht '4\' 8"'$  (1.422 m)   Wt 172 lb 3.2 oz (78.1 kg)   BMI 38.61 kg/m  Body mass index is 38.61 kg/m.  General appearance: Well nourished, well developed female in no acute distress.  Neck:  Supple, normal appearance, and no thyromegaly  Cardiovascular: normal s1 and s2.  No murmurs, rubs or gallops. Respiratory:  Clear to auscultation bilateral. Normal respiratory effort Abdomen: positive bowel sounds and no masses, hernias; diffusely non tender to palpation, non distended Neuro/Psych:  Normal mood and affect.  Skin:  Warm and dry.   Laboratory: no new imaging  Radiology: CT and u/s images reviewed by me Narrative & Impression  CLINICAL DATA:  Right lower quadrant pain for 2 days, initial encounter   EXAM: CT ABDOMEN AND PELVIS WITH CONTRAST   TECHNIQUE: Multidetector CT imaging of the abdomen and pelvis was performed using the standard protocol following bolus administration of intravenous contrast.   RADIATION DOSE REDUCTION: This exam was performed according to the departmental dose-optimization program which includes automated exposure control, adjustment of the mA and/or kV according to patient size and/or use of iterative reconstruction technique.   CONTRAST:  68m OMNIPAQUE IOHEXOL 300 MG/ML  SOLN   COMPARISON:  Pelvis ultrasound from 12/24/2021   FINDINGS: Lower chest: Lung bases are free of acute infiltrate or sizable effusion. Along the medial aspect of the left lung base some dystrophic calcification is noted likely benign in etiology.   Hepatobiliary: No focal liver  abnormality is seen. No gallstones, gallbladder wall thickening, or biliary dilatation.   Pancreas: Unremarkable. No pancreatic ductal dilatation or surrounding inflammatory changes.   Spleen: Normal in size without focal abnormality.   Adrenals/Urinary Tract: Adrenal glands are within normal limits. Kidneys are well visualized within normal enhancement pattern. No renal calculi or obstructive changes are seen. The bladder is well distended.   Stomach/Bowel: No obstructive or inflammatory changes of the colon are seen. The appendix is within normal limits. Small bowel and stomach unremarkable.   Vascular/Lymphatic: No significant vascular findings are present. No enlarged abdominal or pelvic lymph nodes.   Reproductive: Uterus demonstrates multiple uterine fibroids. The largest of these measures approximately 5.3 cm and is hypodense in nature which may represent some necrosis. Smaller uterine fibroids are seen. IUD is noted in place. Adnexa are within normal limits.  Other: No abdominal wall hernia or abnormality. No abdominopelvic ascites.   Musculoskeletal: No acute or significant osseous findings.   IMPRESSION: Uterine fibroid change.   Normal-appearing appendix.   No acute abnormality noted     Electronically Signed   By: Inez Catalina M.D.   On: 04/10/2022 02:28   Narrative & Impression  CLINICAL DATA:  Evaluate for presence of IUD.  IUD threads lost.   EXAM: TRANSABDOMINAL AND TRANSVAGINAL ULTRASOUND OF PELVIS   TECHNIQUE: Both transabdominal and transvaginal ultrasound examinations of the pelvis were performed. Transabdominal technique was performed for global imaging of the pelvis including uterus, ovaries, adnexal regions, and pelvic cul-de-sac. It was necessary to proceed with endovaginal exam following the transabdominal exam to visualize the endometrium.   COMPARISON:  Pelvic ultrasound 10/07/2016   FINDINGS: Uterus   Measurements: 10.3 x 5.3 x  7.5 cm = volume: 213 mL. The uterus is anteverted. Heterogeneous myometrium with multiple fibroids. Hypoechoic 1.9 x 1.7 x 1.6 cm fibroid is seen in the central fundus, this may be myometrial or submucosal. 3.2 x 2.5 x 2.9 cm fibroid is seen within the posterior left uterine body, subserosal. 4.7 x 3.2 x 4.2 cm fibroid is seen in the anterior fundus, subserosal. The IUD strings are seen in the cervix.   Endometrium   Thickness: 7 mm. Shadowing intrauterine device appears to be appropriately positioned in the endometrium.   Right ovary   Measurements: 2.8 x 1.6 x 2.2 cm = volume: 5.2 mL. Normal physiologic appearance. No dominant cyst or solid lesion. Ovarian blood flow is seen. No adnexal mass.   Left ovary   Measurements: 3.4 x 2.2 x 2.8 cm = volume: 11.2 mL. Normal physiologic appearance. No dominant cyst or solid lesion. Ovarian blood flow is seen. No adnexal mass.   Other findings   No abnormal free fluid.   IMPRESSION: 1. Intrauterine device appropriately position in the endometrium. The strings are visualized in the cervix. 2. Multiple uterine fibroids. These have increased in size from prior exam. One of these fibroids may be submucosal. 3. Normal sonographic appearance of the ovaries.     Electronically Signed   By: Keith Rake M.D.   On: 12/25/2021 13:22   Assessment: patient stable  Plan:  1. Other microscopic hematuria - Urine Culture - Urinalysis, Routine w reflex microscopic  2. Abdominal pain Most likely due to necrosis fibroid since it was acute and self limited. Patient desires expectant management which I feel is reasonable. She likes her Mirena and would like another one. She does not desire in office attempt removal again and patient aware would have to go to OR for removal, which she is fine with  Request sent for hysteroscopy, mirena iud removal and mirena insertion (new IUD). Plan to use u/s in OR given fibroids.  I told patient to  consider IUD expired for now  3. Intrauterine contraceptive device threads lost, subsequent encounter  4. Uterine leiomyoma, unspecified location Toradol for PRN pain prescribed.   RTC PRN  Durene Romans MD Attending Center for [employer])

## 2022-07-02 LAB — URINE CULTURE: Organism ID, Bacteria: NO GROWTH

## 2022-08-19 IMAGING — US US PELVIS COMPLETE WITH TRANSVAGINAL
1 series · 15 of 25 positions shown · non-contrast
Comparison: Pelvic ultrasound 10/07/2016

CLINICAL DATA: Evaluate for presence of IUD.  IUD threads lost.



[Series 1: us pelvis complete with transvaginal · 132 acquisitions, 15 frames shown]
[im 1/132]
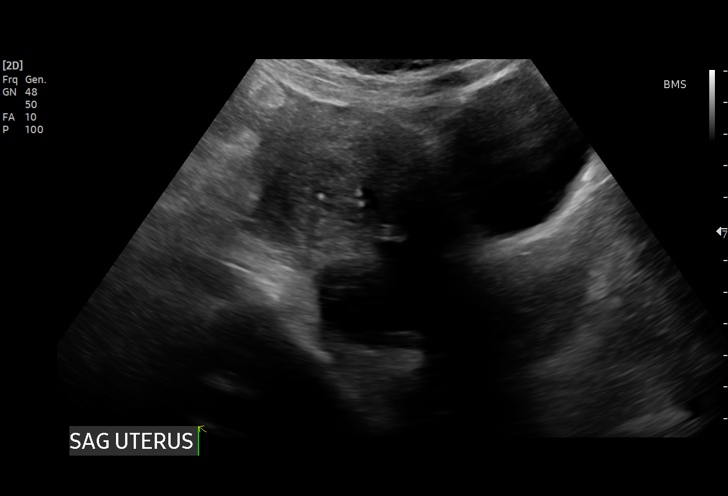
[im 11/132]
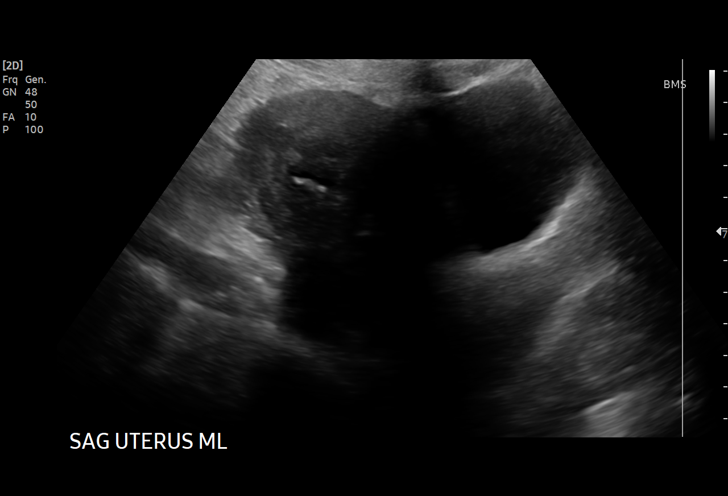
[im 22/132]
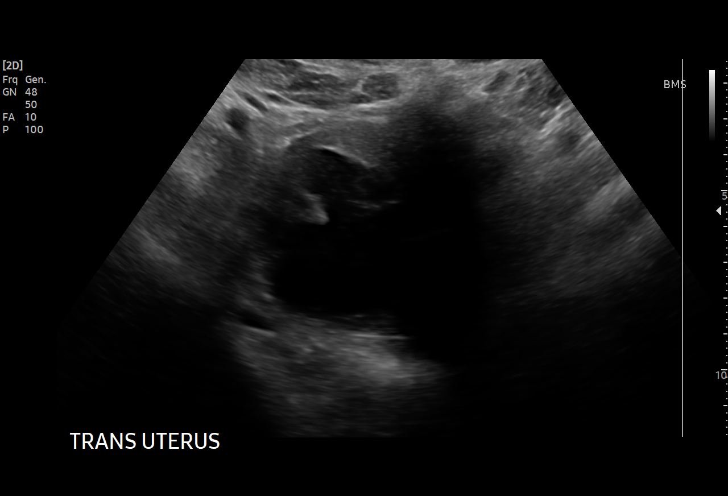
[im 28/132]
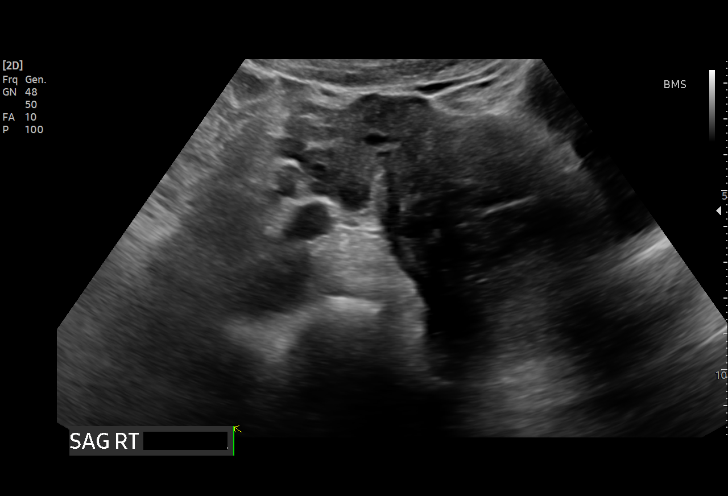
[im 39/132]
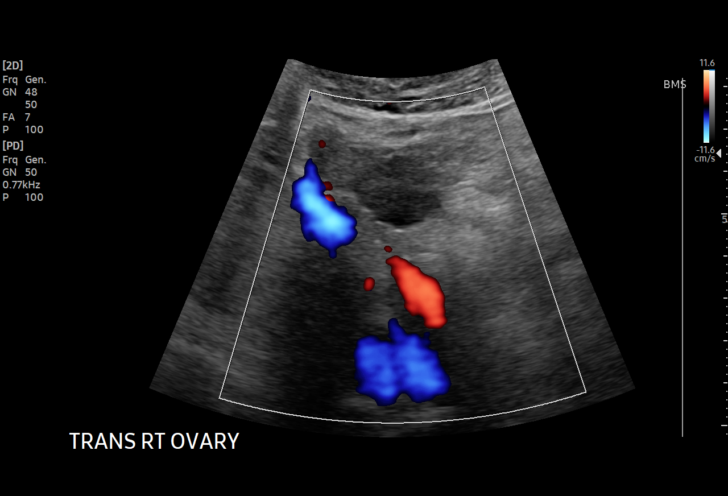
[im 50/132]
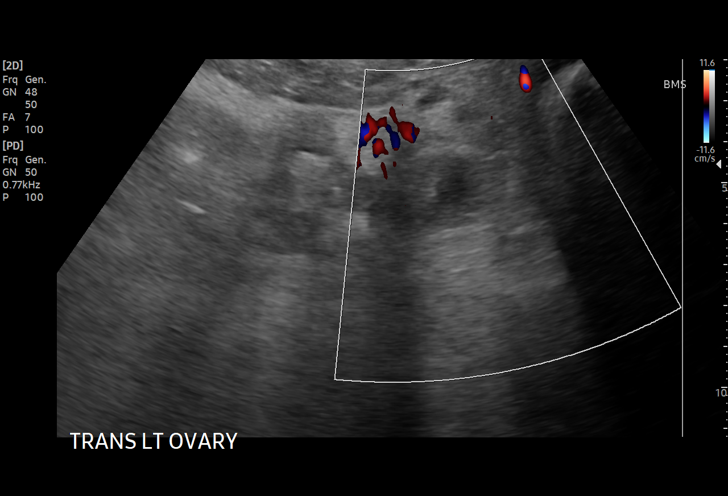
[im 55/132]
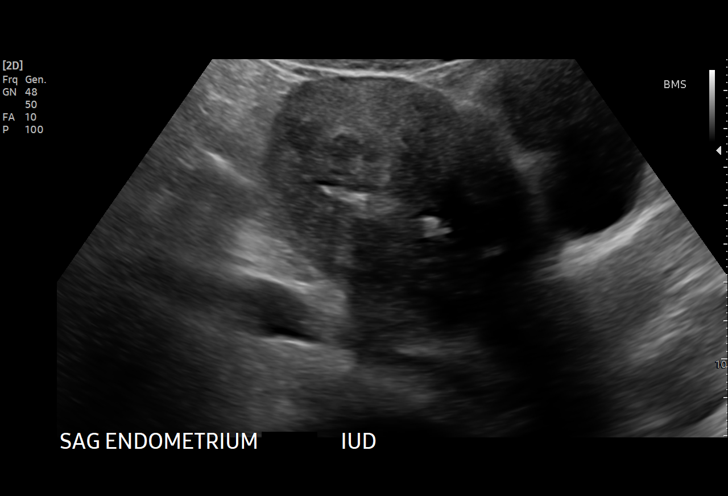
[im 66/132]
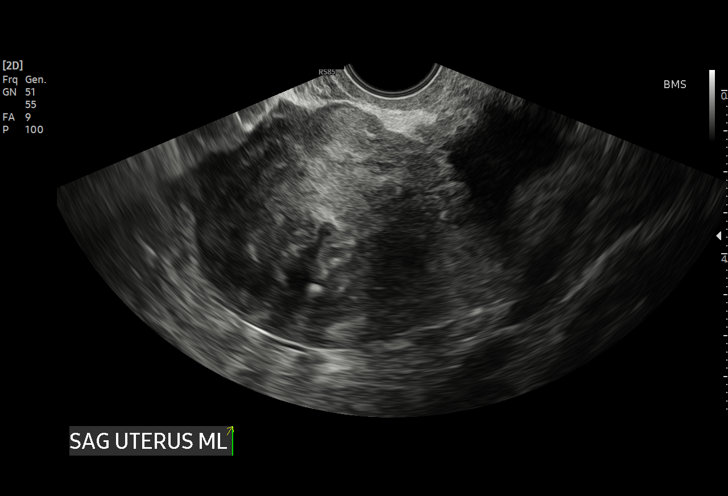
[im 77/132]
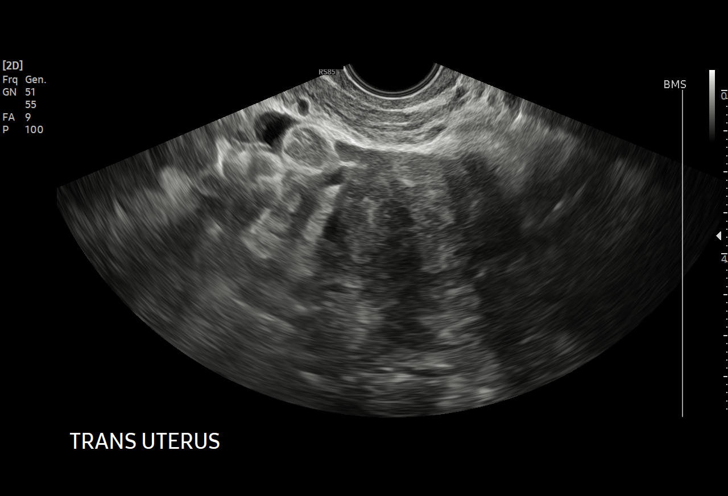
[im 82/132]
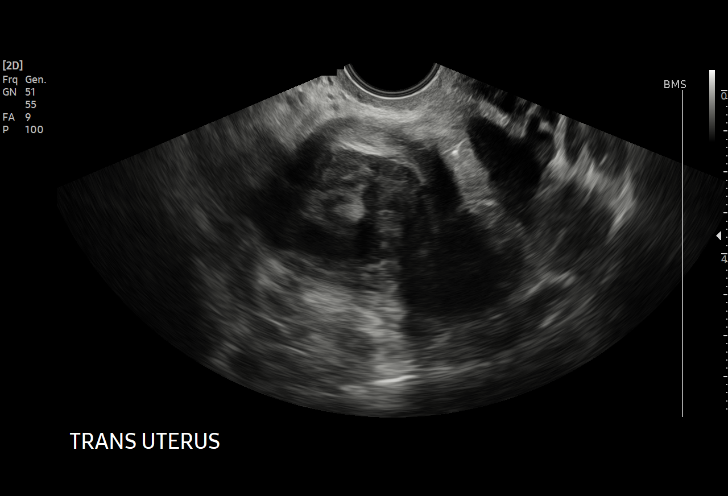
[im 93/132]
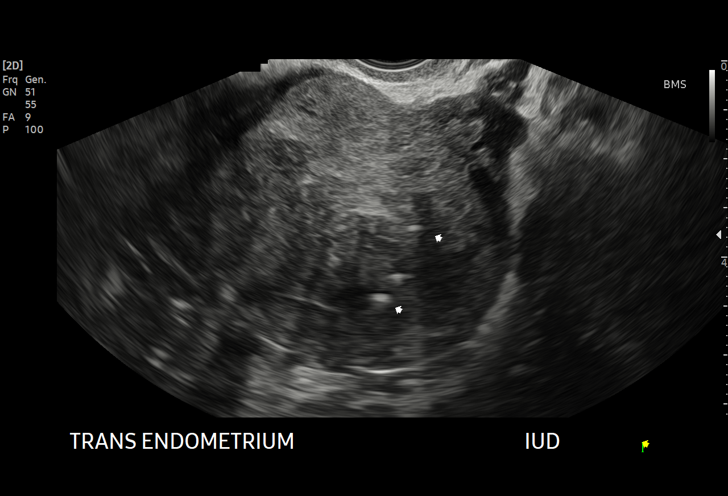
[im 104/132]
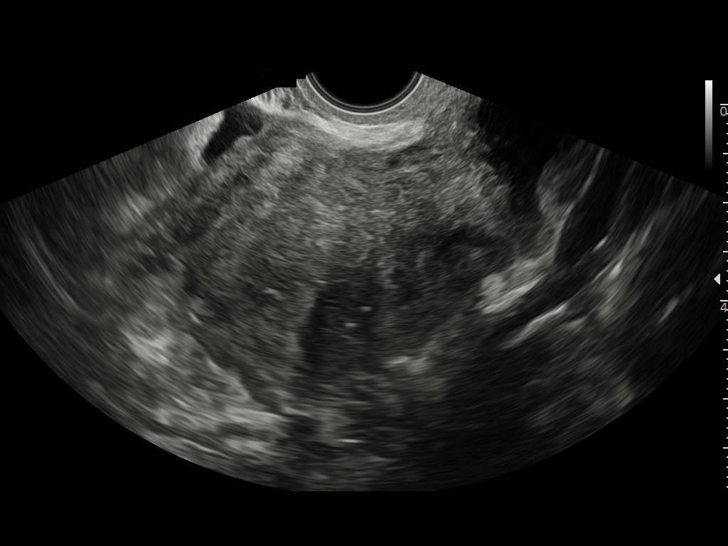
[im 110/132]
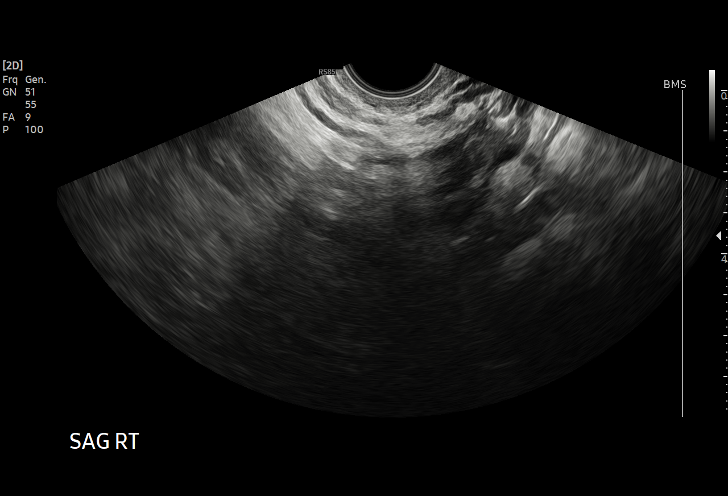
[im 121/132]
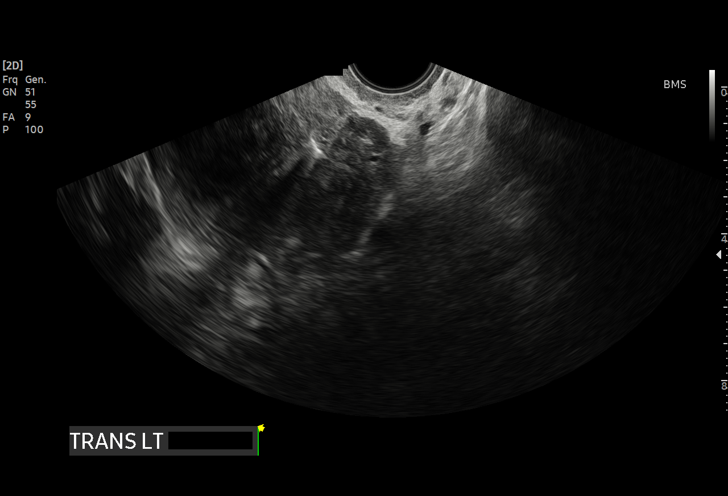
[im 132/132]
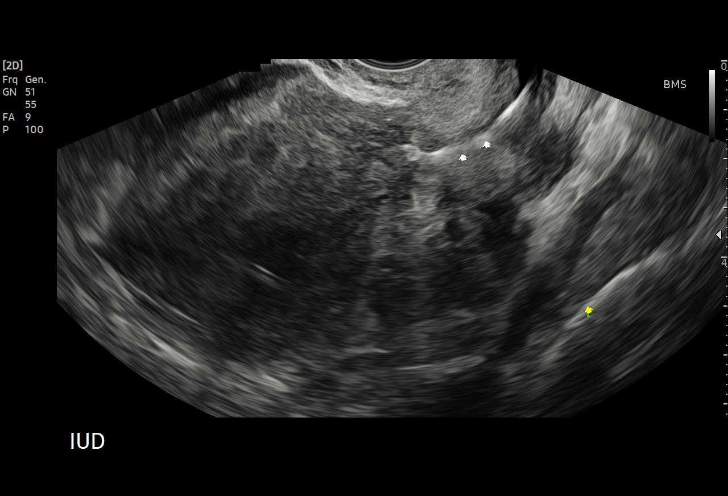

[15 of 25 positions shown; findings below may reference images not displayed]

FINDINGS: Uterus

Measurements: 10.3 x 5.3 x 7.5 cm = volume: 213 mL. The uterus is
anteverted. Heterogeneous myometrium with multiple fibroids.
Hypoechoic 1.9 x 1.7 x 1.6 cm fibroid is seen in the central fundus,
this may be myometrial or submucosal. 3.2 x 2.5 x 2.9 cm fibroid is
seen within the posterior left uterine body, subserosal. 4.7 x 3.2 x
4.2 cm fibroid is seen in the anterior fundus, subserosal. The IUD
strings are seen in the cervix.

Endometrium

Thickness: 7 mm. Shadowing intrauterine device appears to be
appropriately positioned in the endometrium.

Right ovary

Measurements: 2.8 x 1.6 x 2.2 cm = volume: 5.2 mL. Normal
physiologic appearance. No dominant cyst or solid lesion. Ovarian
blood flow is seen. No adnexal mass.

Left ovary

Measurements: 3.4 x 2.2 x 2.8 cm = volume: 11.2 mL. Normal
physiologic appearance. No dominant cyst or solid lesion. Ovarian
blood flow is seen. No adnexal mass.

Other findings

No abnormal free fluid.
IMPRESSION: 1. Intrauterine device appropriately position in the endometrium.
The strings are visualized in the cervix.
2. Multiple uterine fibroids. These have increased in size from
prior exam. One of these fibroids may be submucosal.
3. Normal sonographic appearance of the ovaries.

## 2022-08-28 ENCOUNTER — Other Ambulatory Visit: Payer: Self-pay | Admitting: Obstetrics and Gynecology

## 2022-08-28 DIAGNOSIS — Z01818 Encounter for other preprocedural examination: Secondary | ICD-10-CM

## 2022-09-02 ENCOUNTER — Encounter (HOSPITAL_COMMUNITY): Payer: Self-pay | Admitting: Obstetrics and Gynecology

## 2022-09-02 ENCOUNTER — Other Ambulatory Visit: Payer: Self-pay

## 2022-09-02 ENCOUNTER — Other Ambulatory Visit: Payer: Self-pay | Admitting: Obstetrics and Gynecology

## 2022-09-02 NOTE — H&P (View-Only) (Signed)
Obstetrics & Gynecology Surgical H&P   Date of H&P: 09/02/2022    [facility]  Reason for Admission: scheduled sugery  History of Present Illness: [name] is a 47 y.o. African-American G1P1001 (No LMP recorded. (Menstrual status: IUD).), seen for the above chief complaint. Her past medical history is significant for lost Mirena, fibroids, BMI 38, c-section x 1.   Patient last seen by me on 07/01/22 for h/o abdominal pain and fibroids.    Patient initially referred by her PCP for abdominal pain in mid August. At that visit, she had new onset abdominal pain and bloating so a CT scan was ordered. She previously had an u/s to locate the lost IUD in may and it showed the IUD in the normal position in the uterus. CT scan still showed IUD in the appropriate uterine position but it also showed multiple fibroids and it appeared that a 5-5.5cm one was undergoing some necrosis, which an early may u/s, for locating the IUD, did not show evidence of.    She was referred to GYN for further management. At her 11/6 visit, she declined attempt by me in the office to remove mirena and wanted it done in the OR. She also wanted another mirena as it had been in place in April 2015. Patient also not having any pain or bloating at that visit and s/s felt could be due to the degenerating fibroids she has.   Images reviewed and uterine cavity not distorted and fine for IUD placement.   ROS: A 12-point review of systems was performed and negative, except as stated in the above HPI.  OBGYN History: As per HPI. OB History  Gravida Para Term Preterm AB Living  '1 1 1 '$ 0 0 1  SAB IAB Ectopic Multiple Live Births  0 0 0 0 1    # Outcome Date GA Lbr Len/2nd Weight Sex Delivery Anes PTL Lv  1 Term 10/27/01 [redacted]w[redacted]d 9 lb (4.082 kg) F CS-Unspec   LIV   Past Medical History: Past Medical History:  Diagnosis Date   Hearing loss    Pre-diabetes     Past Surgical  History: Past Surgical History:  Procedure Laterality Date   CESAREAN SECTION      Family History:  Family History  Problem Relation Age of Onset   Hypertension Mother    Diabetes Mother    CAD Mother    Breast cancer Other     Social History:  Social History   Socioeconomic History   Marital status: Single    Spouse name: Not on file   Number of children: Not on file   Years of education: Not on file   Highest education level: Not on file  Occupational History   Not on file  Tobacco Use   Smoking status: Never    Passive exposure: Never   Smokeless tobacco: Never  Vaping Use   Vaping Use: Never used  Substance and Sexual Activity   Alcohol use: Not Currently    Comment: occ   Drug use: No   Sexual activity: Yes    Birth control/protection: I.U.D.  Other Topics Concern   Not on file  Social History Narrative   Not on file   Social Determinants of Health   Financial Resource Strain: Not on file  Food Insecurity: Food Insecurity Present (11/16/2021)   Hunger Vital Sign    Worried About Running Out of Food in the Last Year: Sometimes true  Ran Out of Food in the Last Year: Sometimes true  Transportation Needs: No Transportation Needs (11/16/2021)   PRAPARE - Hydrologist (Medical): No    Lack of Transportation (Non-Medical): No  Physical Activity: Not on file  Stress: Not on file  Social Connections: Not on file  Intimate Partner Violence: Not on file     Allergy: No Known Allergies  Current Outpatient Medications: Current Outpatient Medications  Medication Sig Dispense Refill   ketorolac (TORADOL) 10 MG tablet Take 1 tablet (10 mg total) by mouth every 6 (six) hours as needed. 20 tablet 1   levonorgestrel (MIRENA) 20 MCG/DAY IUD by Intrauterine route.     polyethylene glycol powder (GLYCOLAX/MIRALAX) 17 GM/SCOOP powder Take 17 g by mouth 2 (two) times daily as needed. 3350 g 1   No current facility-administered  medications for this visit.     Physical Exam: From 11/6 BP 137/83   Pulse 75   Ht '4\' 8"'$  (1.422 m)   Wt 172 lb 3.2 oz (78.1 kg)   BMI 38.61 kg/m  Body mass index is 38.61 kg/m.   General appearance: Well nourished, well developed female in no acute distress.  Neck:  Supple, normal appearance, and no thyromegaly  Cardiovascular: normal s1 and s2.  No murmurs, rubs or gallops. Respiratory:  Clear to auscultation bilateral. Normal respiratory effort Abdomen: positive bowel sounds and no masses, hernias; diffusely non tender to palpation, non distended Neuro/Psych:  Normal mood and affect.  Skin:  Warm and dry.   Laboratory:none  Imaging:  Narrative & Impression  CLINICAL DATA:  Right lower quadrant pain for 2 days, initial encounter   EXAM: CT ABDOMEN AND PELVIS WITH CONTRAST   TECHNIQUE: Multidetector CT imaging of the abdomen and pelvis was performed using the standard protocol following bolus administration of intravenous contrast.   RADIATION DOSE REDUCTION: This exam was performed according to the departmental dose-optimization program which includes automated exposure control, adjustment of the mA and/or kV according to patient size and/or use of iterative reconstruction technique.   CONTRAST:  82m OMNIPAQUE IOHEXOL 300 MG/ML  SOLN   COMPARISON:  Pelvis ultrasound from 12/24/2021   FINDINGS: Lower chest: Lung bases are free of acute infiltrate or sizable effusion. Along the medial aspect of the left lung base some dystrophic calcification is noted likely benign in etiology.   Hepatobiliary: No focal liver abnormality is seen. No gallstones, gallbladder wall thickening, or biliary dilatation.   Pancreas: Unremarkable. No pancreatic ductal dilatation or surrounding inflammatory changes.   Spleen: Normal in size without focal abnormality.   Adrenals/Urinary Tract: Adrenal glands are within normal limits. Kidneys are well visualized within normal  enhancement pattern. No renal calculi or obstructive changes are seen. The bladder is well distended.   Stomach/Bowel: No obstructive or inflammatory changes of the colon are seen. The appendix is within normal limits. Small bowel and stomach unremarkable.   Vascular/Lymphatic: No significant vascular findings are present. No enlarged abdominal or pelvic lymph nodes.   Reproductive: Uterus demonstrates multiple uterine fibroids. The largest of these measures approximately 5.3 cm and is hypodense in nature which may represent some necrosis. Smaller uterine fibroids are seen. IUD is noted in place. Adnexa are within normal limits.   Other: No abdominal wall hernia or abnormality. No abdominopelvic ascites.   Musculoskeletal: No acute or significant osseous findings.   IMPRESSION: Uterine fibroid change.   Normal-appearing appendix.   No acute abnormality noted     Electronically Signed  By: Inez Catalina M.D.   On: 04/10/2022 02:28    Narrative & Impression  CLINICAL DATA:  Evaluate for presence of IUD.  IUD threads lost.   EXAM: TRANSABDOMINAL AND TRANSVAGINAL ULTRASOUND OF PELVIS   TECHNIQUE: Both transabdominal and transvaginal ultrasound examinations of the pelvis were performed. Transabdominal technique was performed for global imaging of the pelvis including uterus, ovaries, adnexal regions, and pelvic cul-de-sac. It was necessary to proceed with endovaginal exam following the transabdominal exam to visualize the endometrium.   COMPARISON:  Pelvic ultrasound 10/07/2016   FINDINGS: Uterus   Measurements: 10.3 x 5.3 x 7.5 cm = volume: 213 mL. The uterus is anteverted. Heterogeneous myometrium with multiple fibroids. Hypoechoic 1.9 x 1.7 x 1.6 cm fibroid is seen in the central fundus, this may be myometrial or submucosal. 3.2 x 2.5 x 2.9 cm fibroid is seen within the posterior left uterine body, subserosal. 4.7 x 3.2 x 4.2 cm fibroid is seen in the anterior  fundus, subserosal. The IUD strings are seen in the cervix.   Endometrium   Thickness: 7 mm. Shadowing intrauterine device appears to be appropriately positioned in the endometrium.   Right ovary   Measurements: 2.8 x 1.6 x 2.2 cm = volume: 5.2 mL. Normal physiologic appearance. No dominant cyst or solid lesion. Ovarian blood flow is seen. No adnexal mass.   Left ovary   Measurements: 3.4 x 2.2 x 2.8 cm = volume: 11.2 mL. Normal physiologic appearance. No dominant cyst or solid lesion. Ovarian blood flow is seen. No adnexal mass.   Other findings   No abnormal free fluid.   IMPRESSION: 1. Intrauterine device appropriately position in the endometrium. The strings are visualized in the cervix. 2. Multiple uterine fibroids. These have increased in size from prior exam. One of these fibroids may be submucosal. 3. Normal sonographic appearance of the ovaries.     Electronically Signed   By: Keith Rake M.D.   On: 12/25/2021 13:22    Assessment: Ms. Zarazua is a 47 y.o. G1P1001 (No LMP recorded. (Menstrual status: IUD).) here for iud removal and placement of new mirena iud  Plan: Plan for OR iud removal and placement of a new mirena iud.    Durene Romans MD Attending Center for [facility])

## 2022-09-02 NOTE — H&P (Signed)
Obstetrics & Gynecology Surgical H&P   Date of H&P: 09/02/2022    [facility]  Reason for Admission: scheduled sugery  History of Present Illness: [name] is a 47 y.o. African-American G1P1001 (No LMP recorded. (Menstrual status: IUD).), seen for the above chief complaint. Her past medical history is significant for lost Mirena, fibroids, BMI 38, c-section x 1.   Patient last seen by me on 07/01/22 for h/o abdominal pain and fibroids.    Patient initially referred by her PCP for abdominal pain in mid August. At that visit, she had new onset abdominal pain and bloating so a CT scan was ordered. She previously had an u/s to locate the lost IUD in may and it showed the IUD in the normal position in the uterus. CT scan still showed IUD in the appropriate uterine position but it also showed multiple fibroids and it appeared that a 5-5.5cm one was undergoing some necrosis, which an early may u/s, for locating the IUD, did not show evidence of.    She was referred to GYN for further management. At her 11/6 visit, she declined attempt by me in the office to remove mirena and wanted it done in the OR. She also wanted another mirena as it had been in place in April 2015. Patient also not having any pain or bloating at that visit and s/s felt could be due to the degenerating fibroids she has.   Images reviewed and uterine cavity not distorted and fine for IUD placement.   ROS: A 12-point review of systems was performed and negative, except as stated in the above HPI.  OBGYN History: As per HPI. OB History  Gravida Para Term Preterm AB Living  '1 1 1 '$ 0 0 1  SAB IAB Ectopic Multiple Live Births  0 0 0 0 1    # Outcome Date GA Lbr Len/2nd Weight Sex Delivery Anes PTL Lv  1 Term 10/27/01 [redacted]w[redacted]d 9 lb (4.082 kg) F CS-Unspec   LIV   Past Medical History: Past Medical History:  Diagnosis Date   Hearing loss    Pre-diabetes     Past Surgical  History: Past Surgical History:  Procedure Laterality Date   CESAREAN SECTION      Family History:  Family History  Problem Relation Age of Onset   Hypertension Mother    Diabetes Mother    CAD Mother    Breast cancer Other     Social History:  Social History   Socioeconomic History   Marital status: Single    Spouse name: Not on file   Number of children: Not on file   Years of education: Not on file   Highest education level: Not on file  Occupational History   Not on file  Tobacco Use   Smoking status: Never    Passive exposure: Never   Smokeless tobacco: Never  Vaping Use   Vaping Use: Never used  Substance and Sexual Activity   Alcohol use: Not Currently    Comment: occ   Drug use: No   Sexual activity: Yes    Birth control/protection: I.U.D.  Other Topics Concern   Not on file  Social History Narrative   Not on file   Social Determinants of Health   Financial Resource Strain: Not on file  Food Insecurity: Food Insecurity Present (11/16/2021)   Hunger Vital Sign    Worried About Running Out of Food in the Last Year: Sometimes true  Ran Out of Food in the Last Year: Sometimes true  Transportation Needs: No Transportation Needs (11/16/2021)   PRAPARE - Hydrologist (Medical): No    Lack of Transportation (Non-Medical): No  Physical Activity: Not on file  Stress: Not on file  Social Connections: Not on file  Intimate Partner Violence: Not on file     Allergy: No Known Allergies  Current Outpatient Medications: Current Outpatient Medications  Medication Sig Dispense Refill   ketorolac (TORADOL) 10 MG tablet Take 1 tablet (10 mg total) by mouth every 6 (six) hours as needed. 20 tablet 1   levonorgestrel (MIRENA) 20 MCG/DAY IUD by Intrauterine route.     polyethylene glycol powder (GLYCOLAX/MIRALAX) 17 GM/SCOOP powder Take 17 g by mouth 2 (two) times daily as needed. 3350 g 1   No current facility-administered  medications for this visit.     Physical Exam: From 11/6 BP 137/83   Pulse 75   Ht '4\' 8"'$  (1.422 m)   Wt 172 lb 3.2 oz (78.1 kg)   BMI 38.61 kg/m  Body mass index is 38.61 kg/m.   General appearance: Well nourished, well developed female in no acute distress.  Neck:  Supple, normal appearance, and no thyromegaly  Cardiovascular: normal s1 and s2.  No murmurs, rubs or gallops. Respiratory:  Clear to auscultation bilateral. Normal respiratory effort Abdomen: positive bowel sounds and no masses, hernias; diffusely non tender to palpation, non distended Neuro/Psych:  Normal mood and affect.  Skin:  Warm and dry.   Laboratory:none  Imaging:  Narrative & Impression  CLINICAL DATA:  Right lower quadrant pain for 2 days, initial encounter   EXAM: CT ABDOMEN AND PELVIS WITH CONTRAST   TECHNIQUE: Multidetector CT imaging of the abdomen and pelvis was performed using the standard protocol following bolus administration of intravenous contrast.   RADIATION DOSE REDUCTION: This exam was performed according to the departmental dose-optimization program which includes automated exposure control, adjustment of the mA and/or kV according to patient size and/or use of iterative reconstruction technique.   CONTRAST:  65m OMNIPAQUE IOHEXOL 300 MG/ML  SOLN   COMPARISON:  Pelvis ultrasound from 12/24/2021   FINDINGS: Lower chest: Lung bases are free of acute infiltrate or sizable effusion. Along the medial aspect of the left lung base some dystrophic calcification is noted likely benign in etiology.   Hepatobiliary: No focal liver abnormality is seen. No gallstones, gallbladder wall thickening, or biliary dilatation.   Pancreas: Unremarkable. No pancreatic ductal dilatation or surrounding inflammatory changes.   Spleen: Normal in size without focal abnormality.   Adrenals/Urinary Tract: Adrenal glands are within normal limits. Kidneys are well visualized within normal  enhancement pattern. No renal calculi or obstructive changes are seen. The bladder is well distended.   Stomach/Bowel: No obstructive or inflammatory changes of the colon are seen. The appendix is within normal limits. Small bowel and stomach unremarkable.   Vascular/Lymphatic: No significant vascular findings are present. No enlarged abdominal or pelvic lymph nodes.   Reproductive: Uterus demonstrates multiple uterine fibroids. The largest of these measures approximately 5.3 cm and is hypodense in nature which may represent some necrosis. Smaller uterine fibroids are seen. IUD is noted in place. Adnexa are within normal limits.   Other: No abdominal wall hernia or abnormality. No abdominopelvic ascites.   Musculoskeletal: No acute or significant osseous findings.   IMPRESSION: Uterine fibroid change.   Normal-appearing appendix.   No acute abnormality noted     Electronically Signed  By: Inez Catalina M.D.   On: 04/10/2022 02:28    Narrative & Impression  CLINICAL DATA:  Evaluate for presence of IUD.  IUD threads lost.   EXAM: TRANSABDOMINAL AND TRANSVAGINAL ULTRASOUND OF PELVIS   TECHNIQUE: Both transabdominal and transvaginal ultrasound examinations of the pelvis were performed. Transabdominal technique was performed for global imaging of the pelvis including uterus, ovaries, adnexal regions, and pelvic cul-de-sac. It was necessary to proceed with endovaginal exam following the transabdominal exam to visualize the endometrium.   COMPARISON:  Pelvic ultrasound 10/07/2016   FINDINGS: Uterus   Measurements: 10.3 x 5.3 x 7.5 cm = volume: 213 mL. The uterus is anteverted. Heterogeneous myometrium with multiple fibroids. Hypoechoic 1.9 x 1.7 x 1.6 cm fibroid is seen in the central fundus, this may be myometrial or submucosal. 3.2 x 2.5 x 2.9 cm fibroid is seen within the posterior left uterine body, subserosal. 4.7 x 3.2 x 4.2 cm fibroid is seen in the anterior  fundus, subserosal. The IUD strings are seen in the cervix.   Endometrium   Thickness: 7 mm. Shadowing intrauterine device appears to be appropriately positioned in the endometrium.   Right ovary   Measurements: 2.8 x 1.6 x 2.2 cm = volume: 5.2 mL. Normal physiologic appearance. No dominant cyst or solid lesion. Ovarian blood flow is seen. No adnexal mass.   Left ovary   Measurements: 3.4 x 2.2 x 2.8 cm = volume: 11.2 mL. Normal physiologic appearance. No dominant cyst or solid lesion. Ovarian blood flow is seen. No adnexal mass.   Other findings   No abnormal free fluid.   IMPRESSION: 1. Intrauterine device appropriately position in the endometrium. The strings are visualized in the cervix. 2. Multiple uterine fibroids. These have increased in size from prior exam. One of these fibroids may be submucosal. 3. Normal sonographic appearance of the ovaries.     Electronically Signed   By: Keith Rake M.D.   On: 12/25/2021 13:22    Assessment: Ms. Whitebread is a 48 y.o. G1P1001 (No LMP recorded. (Menstrual status: IUD).) here for iud removal and placement of new mirena iud  Plan: Plan for OR iud removal and placement of a new mirena iud.    Durene Romans MD Attending Center for [facility])

## 2022-09-02 NOTE — Progress Notes (Signed)
PCP - Alcus Dad, MD  ERAS Protcol - Clears until 083  Anesthesia review: N  Patient verbally denies any shortness of breath, fever, cough and chest pain during phone call   -------------  SDW INSTRUCTIONS given:  Your procedure is scheduled on 09/03/22.  Report to [facility] Main Entrance "A" at 0900 A.M., and check in at the Admitting office.  Call this number if you have problems the morning of surgery:  [phone]   Remember:  Do not eat after midnight the night before your surgery  You may drink clear liquids until 0830 the morning of your surgery.   Clear liquids allowed are: Water, Non-Citrus Juices (without pulp), Carbonated Beverages, Clear Tea, Black Coffee Only, and Gatorade    Take these medicines the morning of surgery with A SIP OF WATER  N/A   As of today, STOP taking any Aspirin (unless otherwise instructed by your surgeon) Aleve, Naproxen, Ibuprofen, Motrin, Advil, Goody's, BC's, all herbal medications, fish oil, and all vitamins.                      Do not wear jewelry, make up, or nail polish            Do not wear lotions, powders, perfumes/colognes, or deodorant.            Do not shave 48 hours prior to surgery.  Men may shave face and neck.            Do not bring valuables to the hospital.            [facility] is not responsible for any belongings or valuables.  Do NOT Smoke (Tobacco/Vaping) 24 hours prior to your procedure If you use a CPAP at night, you may bring all equipment for your overnight stay.   Contacts, glasses, dentures or bridgework may not be worn into surgery.      For patients admitted to the hospital, discharge time will be determined by your treatment team.   Patients discharged the day of surgery will not be allowed to drive home, and someone needs to stay with them for 24 hours.    Special instructions:   - Preparing For Surgery  Before surgery, you can play an important role. Because skin is not  sterile, your skin needs to be as free of germs as possible. You can reduce the number of germs on your skin by washing with CHG (chlorahexidine gluconate) Soap before surgery.  CHG is an antiseptic cleaner which kills germs and bonds with the skin to continue killing germs even after washing.    Oral Hygiene is also important to reduce your risk of infection.  Remember - BRUSH YOUR TEETH THE MORNING OF SURGERY WITH YOUR REGULAR TOOTHPASTE  Please do not use if you have an allergy to CHG or antibacterial soaps. If your skin becomes reddened/irritated stop using the CHG.  Do not shave (including legs and underarms) for at least 48 hours prior to first CHG shower. It is OK to shave your face.  Please follow these instructions carefully.   Shower the NIGHT BEFORE SURGERY and the MORNING OF SURGERY with DIAL Soap.   Pat yourself dry with a CLEAN TOWEL.  Wear CLEAN PAJAMAS to bed the night before surgery  Place CLEAN SHEETS on your bed the night of your first shower and DO NOT SLEEP WITH PETS.   Day of Surgery: Please shower morning of surgery  Wear Clean/Comfortable clothing the  morning of surgery Do not apply any deodorants/lotions.   Remember to brush your teeth WITH YOUR REGULAR TOOTHPASTE.   Questions were answered. Patient verbalized understanding of instructions.

## 2022-09-03 ENCOUNTER — Ambulatory Visit (HOSPITAL_COMMUNITY): Payer: BC Managed Care – PPO | Admitting: Certified Registered Nurse Anesthetist

## 2022-09-03 ENCOUNTER — Ambulatory Visit (HOSPITAL_COMMUNITY)
Admission: RE | Admit: 2022-09-03 | Discharge: 2022-09-03 | Disposition: A | Discharge status: Home / Self Care | Payer: BC Managed Care – PPO | Source: Ambulatory Visit | Attending: Obstetrics and Gynecology | Admitting: Obstetrics and Gynecology

## 2022-09-03 ENCOUNTER — Encounter (HOSPITAL_COMMUNITY): Payer: Self-pay | Admitting: Obstetrics and Gynecology

## 2022-09-03 ENCOUNTER — Other Ambulatory Visit: Payer: Self-pay

## 2022-09-03 ENCOUNTER — Encounter (HOSPITAL_COMMUNITY)
Admission: RE | Disposition: A | Discharge status: Home / Self Care | Payer: Self-pay | Source: Ambulatory Visit | Attending: Obstetrics and Gynecology

## 2022-09-03 DIAGNOSIS — Z30433 Encounter for removal and reinsertion of intrauterine contraceptive device: Secondary | ICD-10-CM | POA: Diagnosis present

## 2022-09-03 DIAGNOSIS — Z5941 Food insecurity: Secondary | ICD-10-CM | POA: Insufficient documentation

## 2022-09-03 DIAGNOSIS — T8332XD Displacement of intrauterine contraceptive device, subsequent encounter: Secondary | ICD-10-CM | POA: Diagnosis not present

## 2022-09-03 DIAGNOSIS — R102 Pelvic and perineal pain: Secondary | ICD-10-CM

## 2022-09-03 DIAGNOSIS — I10 Essential (primary) hypertension: Secondary | ICD-10-CM | POA: Insufficient documentation

## 2022-09-03 DIAGNOSIS — Z30432 Encounter for removal of intrauterine contraceptive device: Secondary | ICD-10-CM

## 2022-09-03 DIAGNOSIS — Z9889 Other specified postprocedural states: Secondary | ICD-10-CM

## 2022-09-03 DIAGNOSIS — D259 Leiomyoma of uterus, unspecified: Secondary | ICD-10-CM | POA: Diagnosis present

## 2022-09-03 DIAGNOSIS — G709 Myoneural disorder, unspecified: Secondary | ICD-10-CM | POA: Diagnosis not present

## 2022-09-03 DIAGNOSIS — D250 Submucous leiomyoma of uterus: Secondary | ICD-10-CM | POA: Insufficient documentation

## 2022-09-03 DIAGNOSIS — Z8249 Family history of ischemic heart disease and other diseases of the circulatory system: Secondary | ICD-10-CM | POA: Diagnosis not present

## 2022-09-03 HISTORY — DX: Prediabetes: R73.03

## 2022-09-03 HISTORY — PX: INTRAUTERINE DEVICE (IUD) INSERTION: SHX5877

## 2022-09-03 HISTORY — PX: HYSTEROSCOPY WITH D & C: SHX1775

## 2022-09-03 HISTORY — DX: Other complications of anesthesia, initial encounter: T88.59XA

## 2022-09-03 LAB — CBC
HCT: 42.2 % (ref 36.0–46.0)
Hemoglobin: 13.2 g/dL (ref 12.0–15.0)
MCH: 27.8 pg (ref 26.0–34.0)
MCHC: 31.3 g/dL (ref 30.0–36.0)
MCV: 89 fL (ref 80.0–100.0)
Platelets: 375 10*3/uL (ref 150–400)
RBC: 4.74 MIL/uL (ref 3.87–5.11)
RDW: 12.7 % (ref 11.5–15.5)
WBC: 8.7 10*3/uL (ref 4.0–10.5)
nRBC: 0 % (ref 0.0–0.2)

## 2022-09-03 LAB — POCT PREGNANCY, URINE: Preg Test, Ur: NEGATIVE

## 2022-09-03 SURGERY — DILATATION AND CURETTAGE /HYSTEROSCOPY
Anesthesia: General | Site: Vagina

## 2022-09-03 MED ORDER — SODIUM CHLORIDE 0.9 % IR SOLN
Status: DC | PRN
  Administered 2022-09-03: 1000 mL

## 2022-09-03 MED ORDER — CHLORHEXIDINE GLUCONATE 0.12 % MT SOLN
OROMUCOSAL | Status: AC
  Administered 2022-09-03: 15 mL via OROMUCOSAL
  Filled 2022-09-03: qty 15

## 2022-09-03 MED ORDER — PROPOFOL 10 MG/ML IV BOLUS
INTRAVENOUS | Status: AC
  Filled 2022-09-03: qty 20

## 2022-09-03 MED ORDER — SILVER NITRATE-POT NITRATE 75-25 % EX MISC
CUTANEOUS | Status: DC | PRN
  Administered 2022-09-03: 4

## 2022-09-03 MED ORDER — LEVONORGESTREL 20 MCG/DAY IU IUD
1.0000 | INTRAUTERINE_SYSTEM | INTRAUTERINE | Status: AC
  Administered 2022-09-03: 1 via INTRAUTERINE
  Filled 2022-09-03: qty 1

## 2022-09-03 MED ORDER — ORAL CARE MOUTH RINSE: 15.0000 mL | Freq: Once | OROMUCOSAL | Status: AC

## 2022-09-03 MED ORDER — MIDAZOLAM HCL 2 MG/2ML IJ SOLN
INTRAMUSCULAR | Status: AC
  Filled 2022-09-03: qty 2

## 2022-09-03 MED ORDER — ONDANSETRON HCL 4 MG/2ML IJ SOLN: 4.0000 mg | Freq: Once | INTRAMUSCULAR | Status: DC | PRN

## 2022-09-03 MED ORDER — OXYCODONE HCL 5 MG/5ML PO SOLN: 5.0000 mg | Freq: Once | ORAL | Status: DC | PRN

## 2022-09-03 MED ORDER — ACETAMINOPHEN 160 MG/5ML PO SOLN: 325.0000 mg | ORAL | Status: DC | PRN

## 2022-09-03 MED ORDER — LIDOCAINE 2% (20 MG/ML) 5 ML SYRINGE
INTRAMUSCULAR | Status: DC | PRN
  Administered 2022-09-03: 60 mg via INTRAVENOUS

## 2022-09-03 MED ORDER — FENTANYL CITRATE (PF) 250 MCG/5ML IJ SOLN
INTRAMUSCULAR | Status: AC
  Filled 2022-09-03: qty 5

## 2022-09-03 MED ORDER — OXYCODONE HCL 5 MG PO TABS: 5.0000 mg | ORAL_TABLET | Freq: Once | ORAL | Status: DC | PRN

## 2022-09-03 MED ORDER — FENTANYL CITRATE (PF) 100 MCG/2ML IJ SOLN
25.0000 ug | INTRAMUSCULAR | Status: DC | PRN
  Administered 2022-09-03 (×4): 25 ug via INTRAVENOUS

## 2022-09-03 MED ORDER — IBUPROFEN 200 MG PO TABS: 600.0000 mg | ORAL_TABLET | Freq: Four times a day (QID) | ORAL | 0 refills | Status: DC | PRN

## 2022-09-03 MED ORDER — SODIUM CHLORIDE 0.9 % IV SOLN: INTRAVENOUS | Status: DC

## 2022-09-03 MED ORDER — LIDOCAINE HCL (PF) 1 % IJ SOLN
INTRAMUSCULAR | Status: AC
  Filled 2022-09-03: qty 30

## 2022-09-03 MED ORDER — DEXAMETHASONE SODIUM PHOSPHATE 10 MG/ML IJ SOLN
INTRAMUSCULAR | Status: AC
  Filled 2022-09-03: qty 1

## 2022-09-03 MED ORDER — CHLORHEXIDINE GLUCONATE 0.12 % MT SOLN: 15.0000 mL | Freq: Once | OROMUCOSAL | Status: AC

## 2022-09-03 MED ORDER — LIDOCAINE HCL 1 % IJ SOLN
INTRAMUSCULAR | Status: DC | PRN
  Administered 2022-09-03: 20 mL via INTRADERMAL

## 2022-09-03 MED ORDER — ACETAMINOPHEN 325 MG PO TABS: 325.0000 mg | ORAL_TABLET | ORAL | Status: DC | PRN

## 2022-09-03 MED ORDER — FENTANYL CITRATE (PF) 250 MCG/5ML IJ SOLN
INTRAMUSCULAR | Status: DC | PRN
  Administered 2022-09-03: 75 ug via INTRAVENOUS
  Administered 2022-09-03: 25 ug via INTRAVENOUS

## 2022-09-03 MED ORDER — LACTATED RINGERS IV SOLN: INTRAVENOUS | Status: DC

## 2022-09-03 MED ORDER — DEXAMETHASONE SODIUM PHOSPHATE 10 MG/ML IJ SOLN
INTRAMUSCULAR | Status: DC | PRN
  Administered 2022-09-03: 10 mg via INTRAVENOUS

## 2022-09-03 MED ORDER — ONDANSETRON HCL 4 MG/2ML IJ SOLN
INTRAMUSCULAR | Status: AC
  Filled 2022-09-03: qty 2

## 2022-09-03 MED ORDER — FENTANYL CITRATE (PF) 100 MCG/2ML IJ SOLN
INTRAMUSCULAR | Status: AC
  Filled 2022-09-03: qty 2

## 2022-09-03 MED ORDER — MEPERIDINE HCL 25 MG/ML IJ SOLN: 6.2500 mg | INTRAMUSCULAR | Status: DC | PRN

## 2022-09-03 MED ORDER — SILVER NITRATE-POT NITRATE 75-25 % EX MISC
CUTANEOUS | Status: AC
  Filled 2022-09-03: qty 10

## 2022-09-03 MED ORDER — PROPOFOL 10 MG/ML IV BOLUS
INTRAVENOUS | Status: DC | PRN
  Administered 2022-09-03: 200 mg via INTRAVENOUS

## 2022-09-03 MED ORDER — MIDAZOLAM HCL 2 MG/2ML IJ SOLN
INTRAMUSCULAR | Status: DC | PRN
  Administered 2022-09-03: 2 mg via INTRAVENOUS

## 2022-09-03 MED ORDER — LIDOCAINE 2% (20 MG/ML) 5 ML SYRINGE
INTRAMUSCULAR | Status: AC
  Filled 2022-09-03: qty 5

## 2022-09-03 MED ORDER — ONDANSETRON HCL 4 MG/2ML IJ SOLN
INTRAMUSCULAR | Status: DC | PRN
  Administered 2022-09-03: 4 mg via INTRAVENOUS

## 2022-09-03 SURGICAL SUPPLY — 20 items
CATH ROBINSON RED A/P 16FR (CATHETERS) ×1 IMPLANT
CNTNR URN SCR LID CUP LEK RST (MISCELLANEOUS) IMPLANT
CONT SPEC 4OZ STRL OR WHT (MISCELLANEOUS) ×1
GAUZE 4X4 16PLY ~~LOC~~+RFID DBL (SPONGE) ×1 IMPLANT
GLOVE BIOGEL PI IND STRL 7.0 (GLOVE) ×1 IMPLANT
GLOVE BIOGEL PI IND STRL 7.5 (GLOVE) ×1 IMPLANT
GLOVE SURG SS PI 7.0 STRL IVOR (GLOVE) ×1 IMPLANT
GLOVE SURG UNDER POLY LF SZ7 (GLOVE) ×1 IMPLANT
GOWN STRL REUS W/ TWL LRG LVL3 (GOWN DISPOSABLE) ×1 IMPLANT
GOWN STRL REUS W/ TWL XL LVL3 (GOWN DISPOSABLE) ×1 IMPLANT
GOWN STRL REUS W/TWL LRG LVL3 (GOWN DISPOSABLE) ×1
GOWN STRL REUS W/TWL XL LVL3 (GOWN DISPOSABLE) ×2
HIBICLENS CHG 4% 4OZ BTL (MISCELLANEOUS) ×1 IMPLANT
KIT PROCEDURE FLUENT (KITS) ×1 IMPLANT
NS IRRIG 1000ML POUR BTL (IV SOLUTION) ×1 IMPLANT
PACK GENERAL/GYN (CUSTOM PROCEDURE TRAY) IMPLANT
PACK VAGINAL MINOR WOMEN LF (CUSTOM PROCEDURE TRAY) ×1 IMPLANT
PAD OB MATERNITY 4.3X12.25 (PERSONAL CARE ITEMS) ×1 IMPLANT
SEAL ROD LENS SCOPE MYOSURE (ABLATOR) IMPLANT
TOWEL GREEN STERILE FF (TOWEL DISPOSABLE) ×2 IMPLANT

## 2022-09-03 NOTE — Op Note (Signed)
Operative Note   09/03/2022  PRE-OP DIAGNOSIS: Expired, lost intauterine device. Fibroids   POST-OP DIAGNOSIS: Same  SURGEON: Surgeon(s) and Role:    Aletha Halim, MD - Primary  ASSISTANT: None  PROCEDURE:  Hysteroscopy, intrauterine device removal, dilation and curettage, intrauterine device (Mirena) insertion  ANESTHESIA: General and paracervical block  ESTIMATED BLOOD LOSS: 3m  DRAINS: I/O cath per anesthesia note  TOTAL IV FLUIDS: per anesthesia note  FLUID DEFICIT: 6152m  SPECIMENS: endometrial curettings  VTE PROPHYLAXIS: SCDs to the bilateral lower extremities  ANTIBIOTICS: Not indicated  COMPLICATIONS: none  DISPOSITION: PACU - hemodynamically stable.  CONDITION: stable  FINDINGS: Exam under anesthesia revealed small mobile uterus with no masses and bilateral adnexa without masses or fullness. Normal EGBUS, vagina and cervix and IUD strings NOT seen.  On hysteroscopy, strings noted in the cervical canal and near the internal os. IUD easily removed and noted to be intact with normal string length.  Almost adjacent to the fundus is an atrophic appearing submucosal fibroid, anteriorly, that appeared to be approximately 2-3cm.  Atrophic endometrial cavity with right tubal ostia seen and ?left tubal ostia seen in the cornual area. Scant endometrial curettings  PROCEDURE IN DETAIL:  After informed consent was obtained, the patient was taken to the operating room where anesthesia was obtained without difficulty. The patient was positioned in the dorsal lithotomy position in [place] was examined under anesthesia, with the above noted findings.  The bi-valved speculum was placed inside the patient's vagina, and the the anterior lip of the cervix was seen and grasped with the tenaculum.  A paracervical block was achieved with 2075mf 1% lidocaine and then the uterus sounded to 10cm.  The Kelly clamps and polyp forceps used but unable to find strings  or remove.  Patient then dilated to 21 french and still unable to remove IUD with forceps.    Hysteroscopy showed strings and IUD removed with forceps on next attempt. Intrauterine hysteroscopy with findings above.  Hysteroscope removed and given the location and size of fibroids, I didn't feel I would be able to remove completely especially with her fluid deficit being 610m40m that point. Given her symptoms controlled with a Mirena and the above findings, I felt it best to just place an IUD and not perform a myomectomy. Curettage done and then repeat hysteroscopy done with no evidence of perforation. Hysteroscope removed.   Mirena IUD (EXP 2025/MAR, [id number]) remove from packing, after changing gloves and IUD placed per manufacturer's instructions after repeat sounding to 10cm.  Strings cut to 3-4cm.   Excellent hemostasis was noted, and all instruments were removed, with excellent hemostasis noted throughout.  She was then taken out of dorsal lithotomy.  The patient tolerated the procedure well.  Sponge, lap and instrument counts were correct x2.  The patient was taken to recovery room in excellent condition.  CharDurene RomansAttending Center for [facility]

## 2022-09-03 NOTE — Anesthesia Procedure Notes (Signed)
Procedure Name: LMA Insertion Date/Time: 09/03/2022 11:37 AM  Performed by: Kyung Rudd, CRNAPre-anesthesia Checklist: Patient identified, Emergency Drugs available, Suction available and Patient being monitored Patient Re-evaluated:Patient Re-evaluated prior to induction Oxygen Delivery Method: Circle system utilized Preoxygenation: Pre-oxygenation with 100% oxygen Induction Type: IV induction LMA: LMA inserted LMA Size: 4.0 Number of attempts: 1 Placement Confirmation: positive ETCO2 and breath sounds checked- equal and bilateral Tube secured with: Tape Dental Injury: Teeth and Oropharynx as per pre-operative assessment

## 2022-09-03 NOTE — Interval H&P Note (Signed)
History and Physical Interval Note:  09/03/2022 10:05 AM  [name]  has presented today for surgery, with the diagnosis of Retained IUD Pelvic Pain.  The various methods of treatment have been discussed with the patient and family. After consideration of risks, benefits and other options for treatment, the patient has consented to  Procedure(s): DILATATION AND CURETTAGE /HYSTEROSCOPY (N/A) INTRAUTERINE DEVICE (IUD) INSERTION (N/A) as a surgical intervention.  I also d/w her that, based on the ultrasound images, likely no fibroid amenable to removal, but if so, she would like them removed, which is reasonable. "Possible myomectomy" added to The patient's history has been reviewed, patient examined, no change in status, stable for surgery.  I have reviewed the patient's chart and labs.  Questions were answered to the patient's satisfaction.     Aletha Halim

## 2022-09-03 NOTE — Transfer of Care (Signed)
Immediate Anesthesia Transfer of Care Note  Patient: [name]  Procedure(s) Performed: DILATATION AND CURETTAGE /HYSTEROSCOPY INTRAUTERINE DEVICE (IUD) INSERTION  Patient Location: PACU  Anesthesia Type:General  Level of Consciousness: drowsy  Airway & Oxygen Therapy: Patient Spontanous Breathing  Post-op Assessment: Report given to RN, Post -op Vital signs reviewed and stable, and Patient moving all extremities  Post vital signs: Reviewed and stable  Last Vitals:  Vitals Value Taken Time  BP 153/98 09/03/22 1225  Temp    Pulse 83 09/03/22 1228  Resp 9 09/03/22 1228  SpO2 99 % 09/03/22 1228  Vitals shown include unvalidated device data.  Last Pain:  Vitals:   09/03/22 0955  TempSrc:   PainSc: 0-No pain      Patients Stated Pain Goal: 3 (83/07/35 4301)  Complications: No notable events documented.

## 2022-09-03 NOTE — Anesthesia Preprocedure Evaluation (Signed)
Anesthesia Evaluation  Patient identified by MRN, date of birth, ID band Patient awake    Reviewed: Allergy & Precautions, H&P , NPO status , Patient's Chart, lab work & pertinent test results  History of Anesthesia Complications (+) PROLONGED EMERGENCE and history of anesthetic complications  Airway Mallampati: II  TM Distance: >3 FB Neck ROM: Full    Dental no notable dental hx. (+) Teeth Intact, Dental Advisory Given   Pulmonary neg pulmonary ROS   Pulmonary exam normal breath sounds clear to auscultation       Cardiovascular hypertension, Normal cardiovascular exam Rhythm:Regular Rate:Normal     Neuro/Psych  Neuromuscular disease negative neurological ROS  negative psych ROS   GI/Hepatic negative GI ROS, Neg liver ROS,,,  Endo/Other  negative endocrine ROS    Renal/GU negative Renal ROS  negative genitourinary   Musculoskeletal negative musculoskeletal ROS (+)    Abdominal   Peds negative pediatric ROS (+)  Hematology negative hematology ROS (+)   Anesthesia Other Findings   Reproductive/Obstetrics negative OB ROS                             Anesthesia Physical Anesthesia Plan  ASA: 2  Anesthesia Plan: General   Post-op Pain Management:    Induction: Intravenous  PONV Risk Score and Plan: 3 and Ondansetron and Dexamethasone  Airway Management Planned: LMA  Additional Equipment: None  Intra-op Plan:   Post-operative Plan: Extubation in OR  Informed Consent:   Plan Discussed with: CRNA and Anesthesiologist  Anesthesia Plan Comments: ( )       Anesthesia Quick Evaluation

## 2022-09-04 ENCOUNTER — Encounter (HOSPITAL_COMMUNITY): Payer: Self-pay | Admitting: Obstetrics and Gynecology

## 2022-09-04 LAB — SURGICAL PATHOLOGY

## 2022-09-04 NOTE — Anesthesia Postprocedure Evaluation (Signed)
Anesthesia Post Note  Patient: SIAN ROCKERS  Procedure(s) Performed: DILATATION AND CURETTAGE /HYSTEROSCOPY AND ENDOMETRIAL CURETTING (Vagina ) INTRAUTERINE DEVICE (IUD) REMOVAL AND INSERTION (Vagina )     Patient location during evaluation: PACU Anesthesia Type: General Level of consciousness: awake and alert Pain management: pain level controlled Vital Signs Assessment: post-procedure vital signs reviewed and stable Respiratory status: spontaneous breathing, nonlabored ventilation, respiratory function stable and patient connected to nasal cannula oxygen Cardiovascular status: blood pressure returned to baseline and stable Postop Assessment: no apparent nausea or vomiting Anesthetic complications: no   No notable events documented.  Last Vitals:  Vitals:   09/03/22 1315 09/03/22 1332  BP: 128/77 (!) 143/85  Pulse: 67 66  Resp: 11 12  Temp:  36.8 C  SpO2: 100% 100%    Last Pain:  Vitals:   09/03/22 1315  TempSrc:   PainSc: Asleep                 Tiajuana Amass

## 2022-09-10 ENCOUNTER — Telehealth: Payer: Self-pay | Admitting: Family Medicine

## 2022-09-10 NOTE — Telephone Encounter (Signed)
Patient had surgery last Tuesday and is still hurting from it, would like a nurse to give a call back.

## 2022-09-11 NOTE — Telephone Encounter (Addendum)
Called pt and discussed her concern. She reports that she has been having pain in her lower abdomen since her procedure last week and the pain became worse yesterday. She reports taking Ibuprofen 1500 mg twice yesterday with some relief. She is having the same level of pain today. Upon further discussion and asking pt to read the name on the bottle of OTC medication, she actually took 1500 mg of Acetaminophen. She thought this was ibuprofen and was taking the amount of 3 tablets which had been prescribed upon discharged. I advised pt that she can only take 2 tablets every 6 hours of that particular medication otherwise she risks liver damage. I also advised her to obtain OTC Ibuprofen and to begin taking 3 of those tablets (600 mg total) every 6 hours as needed for pain. Pt stated that she has a prescription of Ibuprofen 800 mg in her possession and asked if she can take that. I stated that she may take 1 tablet every 8 hours as needed of that prescription. I also added that she may take the Acetaminophen as well. Pt was instructed to contact us tomorrow if her pain is the same or has worsened. Pt stated that she had some spotting yesterday. I advised that spotting can be normal. She should notify us if she develops heavy vagina bleeding like a period. Pt voiced understanding of all information and instructions given.

## 2022-10-15 ENCOUNTER — Ambulatory Visit: Payer: BC Managed Care – PPO | Admitting: Obstetrics and Gynecology

## 2022-10-15 ENCOUNTER — Other Ambulatory Visit: Payer: Self-pay

## 2022-10-15 ENCOUNTER — Encounter: Payer: Self-pay | Admitting: Obstetrics and Gynecology

## 2022-10-15 ENCOUNTER — Other Ambulatory Visit (HOSPITAL_COMMUNITY)
Admission: RE | Admit: 2022-10-15 | Discharge: 2022-10-15 | Disposition: A | Discharge status: Home / Self Care | Payer: BC Managed Care – PPO | Source: Ambulatory Visit | Attending: Obstetrics and Gynecology | Admitting: Obstetrics and Gynecology

## 2022-10-15 VITALS — BP 129/85 | HR 65 | Wt 176.6 lb

## 2022-10-15 DIAGNOSIS — T8332XA Displacement of intrauterine contraceptive device, initial encounter: Secondary | ICD-10-CM | POA: Diagnosis not present

## 2022-10-15 DIAGNOSIS — N898 Other specified noninflammatory disorders of vagina: Secondary | ICD-10-CM | POA: Diagnosis not present

## 2022-10-15 DIAGNOSIS — Z124 Encounter for screening for malignant neoplasm of cervix: Secondary | ICD-10-CM | POA: Insufficient documentation

## 2022-10-15 NOTE — Progress Notes (Signed)
Obstetrics and Gynecology Visit Return Patient Evaluation  Appointment Date: 10/15/2022  Primary Care Provider: [place] [place] [facility]   Chief Complaint: routine post op visit  History of Present Illness:  [name] is a 47 y.o. s/p 1/9 expired IUD removal, hysteroscopy, d&c and new Mirena placement; small submucosal fibroid seen and final pathology negative  Interval History: Since that time, she states that she is doing well. She had some slight cramping on the left side right after surgery; pt having some slight vag discharge  Review of Systems:  as noted in the History of Present Illness.  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 Current Outpatient Medications  Medication Sig Dispense Refill   levonorgestrel (MIRENA) 20 MCG/DAY IUD by Intrauterine route.     ibuprofen (MOTRIN IB) 200 MG tablet Take 3 tablets (600 mg total) by mouth every 6 (six) hours as needed. (Patient not taking: Reported on 10/15/2022) 30 tablet 0   polyethylene glycol powder (GLYCOLAX/MIRALAX) 17 GM/SCOOP powder Take 17 g by mouth 2 (two) times daily as needed. (Patient not taking: Reported on 10/15/2022) 3350 g 1   No current facility-administered medications for this visit.    Allergies: has No Known Allergies.  Physical Exam:  BP 129/85   Pulse 65   Wt 176 lb 9.6 oz (80.1 kg)   BMI 39.59 kg/m  Body mass index is 39.59 kg/m. General appearance: Well nourished, well developed female in no acute distress.  Abdomen:  diffusely non tender to palpation, non distended, and no masses, hernias Neuro/Psych:  Normal mood and affect.    Pelvic exam:  EGBUS: normal Vaginal vault: normal Cervix:  normal. IUD strings NOT seen Bimanual: negative  Bedside transvaginal ultrasound performed and IUD seen in mid fundal position (see media tab for images)  Assessment: patient doing well  Plan:  1. Cervical cancer screening - Cytology - PAP( [place]) - Cervicovaginal ancillary only( [place])  2. Vaginal discharge - Cytology - PAP( [place]) - Cervicovaginal ancillary only( [place])  3. Intrauterine contraceptive device threads lost, initial encounter Strings not seen but normal bedside transvag u/s. If cramping comes back, pt told to let us know and may get formal u/s   RTC: PRN  Durene Romans MD Attending Center for [facility])

## 2022-10-16 LAB — CERVICOVAGINAL ANCILLARY ONLY
Bacterial Vaginitis (gardnerella): NEGATIVE
Candida Glabrata: NEGATIVE
Candida Vaginitis: NEGATIVE
Chlamydia: NEGATIVE
Comment: NEGATIVE
Comment: NEGATIVE
Comment: NEGATIVE
Comment: NEGATIVE
Comment: NEGATIVE
Comment: NORMAL
Neisseria Gonorrhea: NEGATIVE
Trichomonas: NEGATIVE

## 2022-10-17 LAB — CYTOLOGY - PAP
Comment: NEGATIVE
Diagnosis: NEGATIVE
High risk HPV: NEGATIVE

## 2022-12-17 ENCOUNTER — Encounter: Payer: Self-pay | Admitting: Family Medicine

## 2022-12-17 ENCOUNTER — Ambulatory Visit (INDEPENDENT_AMBULATORY_CARE_PROVIDER_SITE_OTHER): Payer: BC Managed Care – PPO | Admitting: Family Medicine

## 2022-12-17 VITALS — BP 131/70 | HR 80 | Ht <= 58 in | Wt 182.4 lb

## 2022-12-17 DIAGNOSIS — E559 Vitamin D deficiency, unspecified: Secondary | ICD-10-CM

## 2022-12-17 DIAGNOSIS — R4589 Other symptoms and signs involving emotional state: Secondary | ICD-10-CM | POA: Diagnosis not present

## 2022-12-17 DIAGNOSIS — R7303 Prediabetes: Secondary | ICD-10-CM | POA: Diagnosis not present

## 2022-12-17 DIAGNOSIS — R5383 Other fatigue: Secondary | ICD-10-CM | POA: Diagnosis not present

## 2022-12-17 DIAGNOSIS — Z6841 Body Mass Index (BMI) 40.0 and over, adult: Secondary | ICD-10-CM | POA: Diagnosis not present

## 2022-12-17 MED ORDER — ZEPBOUND 2.5 MG/0.5ML ~~LOC~~ SOAJ: 2.5000 mg | SUBCUTANEOUS | 1 refills | Status: DC

## 2022-12-17 MED ORDER — FLUOXETINE HCL 20 MG PO TABS: 20.0000 mg | ORAL_TABLET | Freq: Every day | ORAL | 0 refills | Status: DC

## 2022-12-17 NOTE — Patient Instructions (Addendum)
It was great to see you!  Things we discussed at today's visit: -We are checking some labs. I will send you a MyChart message with the results or call if needed. -Some of your low energy may be from depression. This is incredibly common in our stressful world. We will start a medication to help with this.  It can take 6-8 weeks to reach its full effect so stick with it even if you don't notice a difference right away. -In addition I recommend seeing a therapist on a regular basis.  You can contact your insurance to see who is covered.  You can also use [email or web address] to find a therapist. Or you can call the [facility] for therapy. [facility]  [address] [place], [place] [phone] Crisis [phone] Lastly, [facility] and [facility] are two apps you could use for therapy  -We will also start a medication to help with weight.  This may need prior authorization from your insurance.  It is a once weekly injection.  Most common side effects are GI upset including nausea/vomiting.  Follow up with me in 6 weeks  Take care and seek immediate care sooner if you develop any concerns.  Dr. [facility]

## 2022-12-17 NOTE — Progress Notes (Unsigned)
    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     Maury Dus, MD [facility]

## 2022-12-18 LAB — TSH RFX ON ABNORMAL TO FREE T4: TSH: 0.786 u[IU]/mL (ref 0.450–4.500)

## 2022-12-18 LAB — COMPREHENSIVE METABOLIC PANEL
ALT: 16 IU/L (ref 0–32)
AST: 15 IU/L (ref 0–40)
Albumin/Globulin Ratio: 1.8 (ref 1.2–2.2)
Albumin: 4.3 g/dL (ref 3.9–4.9)
Alkaline Phosphatase: 45 IU/L (ref 44–121)
BUN/Creatinine Ratio: 10 (ref 9–23)
BUN: 8 mg/dL (ref 6–24)
Bilirubin Total: 0.4 mg/dL (ref 0.0–1.2)
CO2: 25 mmol/L (ref 20–29)
Calcium: 9.6 mg/dL (ref 8.7–10.2)
Chloride: 102 mmol/L (ref 96–106)
Creatinine, Ser: 0.82 mg/dL (ref 0.57–1.00)
Globulin, Total: 2.4 g/dL (ref 1.5–4.5)
Glucose: 85 mg/dL (ref 70–99)
Potassium: 3.9 mmol/L (ref 3.5–5.2)
Sodium: 140 mmol/L (ref 134–144)
Total Protein: 6.7 g/dL (ref 6.0–8.5)
eGFR: 89 mL/min/{1.73_m2} (ref >59)

## 2022-12-18 LAB — HEMOGLOBIN A1C
Est. average glucose Bld gHb Est-mCnc: 117 mg/dL
Hgb A1c MFr Bld: 5.7 % — ABNORMAL HIGH (ref 4.8–5.6)

## 2022-12-18 LAB — VITAMIN D 25 HYDROXY (VIT D DEFICIENCY, FRACTURES): Vit D, 25-Hydroxy: 23.9 ng/mL — ABNORMAL LOW (ref 30.0–100.0)

## 2022-12-18 MED ORDER — CHOLECALCIFEROL 1.25 MG (50000 UT) PO TABS: 1.0000 | ORAL_TABLET | ORAL | 0 refills | Status: DC

## 2022-12-18 NOTE — Assessment & Plan Note (Signed)
6 months of fatigue. History suggestive of depression. Patient amenable to medication so will start fluoxetine  daily. Also recommended counseling and resources provided in AVS. Will check labs including CMP, TSH, vit D today to rule out other causes. Had normal CBC 3 months ago.

## 2022-12-18 NOTE — Assessment & Plan Note (Signed)
BMI 40 with comorbidities including prediabetes. Has not been successful with attempts at lifestyle modifications. Start Zepbound 2.5mg  weekly.

## 2022-12-18 NOTE — Assessment & Plan Note (Signed)
Check updated A1c today

## 2022-12-19 ENCOUNTER — Telehealth: Payer: Self-pay

## 2022-12-19 MED ORDER — VITAMIN D (ERGOCALCIFEROL) 1.25 MG (50000 UNIT) PO CAPS: 50000.0000 [IU] | ORAL_CAPSULE | ORAL | 0 refills | Status: DC

## 2022-12-19 NOTE — Telephone Encounter (Signed)
Received fax from pharmacy requesting to change Cholecalciferol tablets to capsules.   Pharmacy reports they can not order the tablets.   Will forward to PCP.

## 2022-12-19 NOTE — Telephone Encounter (Signed)
New rx sent for capsules.   Maury Dus, MD PGY-3, [facility]

## 2022-12-27 ENCOUNTER — Telehealth: Payer: Self-pay

## 2022-12-27 NOTE — Telephone Encounter (Signed)
A Prior Authorization was initiated for this patients ZEPBOUND through CoverMyMeds.   Key: [id number]

## 2022-12-30 ENCOUNTER — Other Ambulatory Visit (HOSPITAL_COMMUNITY): Payer: Self-pay

## 2022-12-30 NOTE — Telephone Encounter (Signed)
Prior Auth for patients medication ZEPBOUND denied by [facility] MEDICAID via CoverMyMeds.   Reason: The requested drug is being used for weight loss. Drugs used for this reason are not covered under your plan.  CoverMyMeds Key: [id number]

## 2022-12-30 NOTE — Telephone Encounter (Signed)
A Prior Authorization was initiated for this patients ZEPBOUND through CoverMyMeds (COMMERCIAL PLAN).   Key: [id number]

## 2022-12-31 ENCOUNTER — Other Ambulatory Visit (HOSPITAL_COMMUNITY): Payer: Self-pay

## 2022-12-31 NOTE — Telephone Encounter (Signed)
Prior Auth for patients medication ZEPBOUND denied by [facility] via CoverMyMeds.   Reason:    CoverMyMeds Key: [id number]

## 2023-04-04 ENCOUNTER — Other Ambulatory Visit: Payer: Self-pay

## 2023-04-04 ENCOUNTER — Other Ambulatory Visit (HOSPITAL_COMMUNITY): Admission: RE | Admit: 2023-04-04 | Payer: BC Managed Care – PPO | Source: Ambulatory Visit | Admitting: Family Medicine

## 2023-04-04 ENCOUNTER — Ambulatory Visit (INDEPENDENT_AMBULATORY_CARE_PROVIDER_SITE_OTHER): Payer: BC Managed Care – PPO | Admitting: Family Medicine

## 2023-04-04 ENCOUNTER — Encounter: Payer: Self-pay | Admitting: Family Medicine

## 2023-04-04 VITALS — BP 139/95 | HR 98 | Ht <= 58 in | Wt 178.4 lb

## 2023-04-04 DIAGNOSIS — N898 Other specified noninflammatory disorders of vagina: Secondary | ICD-10-CM

## 2023-04-04 DIAGNOSIS — J069 Acute upper respiratory infection, unspecified: Secondary | ICD-10-CM

## 2023-04-04 DIAGNOSIS — Z1211 Encounter for screening for malignant neoplasm of colon: Secondary | ICD-10-CM | POA: Diagnosis not present

## 2023-04-04 DIAGNOSIS — R030 Elevated blood-pressure reading, without diagnosis of hypertension: Secondary | ICD-10-CM | POA: Diagnosis not present

## 2023-04-04 LAB — POCT WET PREP (WET MOUNT)
Clue Cells Wet Prep Whiff POC: NEGATIVE
Trichomonas Wet Prep HPF POC: ABSENT

## 2023-04-04 NOTE — Patient Instructions (Addendum)
It was great to see you today! Here's what we talked about:  I will let you know your test results. If your symptoms worsen, let me know. I am glad the wax is out! Use Debrox drops weekly to help prevent this in the future.  Please let me know if you have any other questions.  Dr. Phineas Real

## 2023-04-04 NOTE — Progress Notes (Unsigned)
    SUBJECTIVE:   CHIEF COMPLAINT / HPI:   Possible yeast infection Has had symptoms of itchiness over the last 2 days. No discharge noted. Has history of BV.  Viral URI Since Tuesday, ear fullness  Elevated BP reading Has had in past  PERTINENT  PMH / PSH: ***  OBJECTIVE:   BP (!) 126/93   Pulse 98   Ht 4\' 8"  (1.422 m)   Wt 178 lb 6 oz (80.9 kg)   SpO2 99%   BMI 39.99 kg/m   General: Alert and oriented, in NAD Skin: Warm, dry, and intact without lesions HEENT: NCAT, EOM grossly normal, midline nasal septum Cardiac: RRR, no m/r/g appreciated Respiratory: CTAB, breathing and speaking comfortably on RA Abdominal: Soft, nontender, nondistended, normoactive bowel sounds Extremities: Moves all extremities grossly equally Neurological: No gross focal deficit Psychiatric: Appropriate mood and affect   ASSESSMENT/PLAN:   No problem-specific Assessment & Plan notes found for this encounter.   Health maintenance Colonoscopy referred  Janeal Holmes, MD [facility]

## 2023-04-05 NOTE — Assessment & Plan Note (Signed)
Elevated again today. Could be due to stress vs whitecoat HTN vs worsening primary HTN. CMP in 11/2022 with normal lytes. Reassured by exam today. Discussed lifestyle changes. Follow up at next visit and consider pharmacologic management.

## 2023-04-10 ENCOUNTER — Other Ambulatory Visit: Payer: Self-pay

## 2023-04-16 ENCOUNTER — Ambulatory Visit (INDEPENDENT_AMBULATORY_CARE_PROVIDER_SITE_OTHER): Payer: BC Managed Care – PPO | Admitting: Student

## 2023-04-16 VITALS — BP 116/62 | HR 84 | Wt 175.0 lb

## 2023-04-16 DIAGNOSIS — E559 Vitamin D deficiency, unspecified: Secondary | ICD-10-CM

## 2023-04-16 DIAGNOSIS — T148XXA Other injury of unspecified body region, initial encounter: Secondary | ICD-10-CM

## 2023-04-16 MED ORDER — IBUPROFEN 800 MG PO TABS
800.0000 mg | ORAL_TABLET | Freq: Three times a day (TID) | ORAL | 0 refills | Status: AC | PRN
Start: 2023-04-16 — End: 2023-04-21

## 2023-04-16 MED ORDER — METHOCARBAMOL 500 MG PO TABS: 500.0000 mg | ORAL_TABLET | Freq: Every evening | ORAL | 0 refills | Status: DC | PRN

## 2023-04-16 MED ORDER — KETOROLAC TROMETHAMINE 15 MG/ML IJ SOLN
15.0000 mg | Freq: Once | INTRAMUSCULAR | Status: AC
Start: 2023-04-16 — End: 2023-04-16
  Administered 2023-04-16: 15 mg via INTRAMUSCULAR

## 2023-04-16 NOTE — Patient Instructions (Signed)
It was great to see you! Thank you for allowing me to participate in your care!   Our plans for today:  -We gave you a shot of medicine to help with pain -I have sent in ibuprofen to take 3 times daily as needed with food for this muscle spasm -I have also sent in a muscle relaxer to take at nighttime as needed  Take care and seek immediate care sooner if you develop any concerns.  Levin Erp, MD

## 2023-04-16 NOTE — Progress Notes (Signed)
    SUBJECTIVE:   CHIEF COMPLAINT / HPI: Left sided muscle pain  Left side of back up to shoulder  Had a lot of coughing previously and says she may have strained it after a coughing fit States that her coughing is gotten better however her side started hurting a lot on Saturday It is the left side of her back and travels up to her left shoulder Sometimes has left-sided chest pain as well that is worse with deep breaths and tender when palpating Taking 800 mg ibuprofen two times a day -did help when taking it but she has run out of this Position changes make it worse laying on side, up off the bed and any deep breathing No lower extremity weakness, no sensation differences, no saddle anesthesia  PERTINENT  PMH / PSH: prediabetes  OBJECTIVE:   BP 116/62   Pulse 84   Wt 175 lb (79.4 kg)   SpO2 98%   BMI 39.23 kg/m   General: Well appearing, NAD, awake, alert, responsive to questions Head: Normocephalic atraumatic CV: Regular rate and rhythm no murmurs rubs or gallops Respiratory/Chest: Clear to ausculation bilaterally, no wheezes rales or crackles, chest rises symmetrically,  no increased work of breathing, tenderness to palpation along left anterior ribs MSK: Position changes worsen pain along left shoulder blade and left upper back, no CVA tenderness  ASSESSMENT/PLAN:   Muscle strain Exam and history most consistent with muscle strain related to a coughing fit.  Chest pain appears muscular in nature given tenderness to palpation on examination.  Discussed that we can treat with NSAIDs for 5 days and if not improved after this to please return to care.  Unlikely to be pulmonary embolism given normal pulse/no shortness of breath. - ketorolac (TORADOL) 15 MG/ML injection 15 mg given in clinic - ibuprofen (ADVIL) 800 MG tablet; Take 1 tablet (800 mg total) by mouth every 8 (eight) hours as needed for up to 5 days for moderate pain (take with food).  Dispense: 15 tablet; Refill: 0 -  methocarbamol (ROBAXIN) 500 MG tablet; Take 1 tablet (500 mg total) by mouth at bedtime as needed for muscle spasms.  Dispense: 10 tablet; Refill: 0   - ED/return precautions discussed with patient  Levin Erp, MD [facility]

## 2023-05-08 ENCOUNTER — Ambulatory Visit: Payer: BC Managed Care – PPO | Admitting: Student

## 2023-05-08 NOTE — Progress Notes (Deleted)
  SUBJECTIVE:   CHIEF COMPLAINT / HPI:   Follow-up:  PERTINENT  PMH / PSH:  Prediabetes  OBJECTIVE:  There were no vitals taken for this visit. Physical Exam   ASSESSMENT/PLAN:  There are no diagnoses linked to this encounter. No follow-ups on file. Shelby Mattocks, DO 05/08/2023, 10:38 AM PGY-3, [facility] {    This will disappear when note is signed, click to select method of visit    :1}

## 2023-06-10 ENCOUNTER — Ambulatory Visit (AMBULATORY_SURGERY_CENTER): Payer: BC Managed Care – PPO

## 2023-06-10 VITALS — Ht <= 58 in | Wt 180.0 lb

## 2023-06-10 DIAGNOSIS — Z1211 Encounter for screening for malignant neoplasm of colon: Secondary | ICD-10-CM

## 2023-06-10 NOTE — Progress Notes (Signed)

## 2023-06-17 ENCOUNTER — Encounter: Payer: Self-pay | Admitting: Gastroenterology

## 2023-06-17 ENCOUNTER — Ambulatory Visit (AMBULATORY_SURGERY_CENTER): Payer: Medicaid Other | Admitting: Gastroenterology

## 2023-06-17 VITALS — BP 143/90 | HR 68 | Temp 98.6°F | Resp 14 | Ht <= 58 in | Wt 180.0 lb

## 2023-06-17 DIAGNOSIS — R7303 Prediabetes: Secondary | ICD-10-CM | POA: Diagnosis not present

## 2023-06-17 DIAGNOSIS — Z1211 Encounter for screening for malignant neoplasm of colon: Secondary | ICD-10-CM | POA: Diagnosis not present

## 2023-06-17 MED ORDER — SODIUM CHLORIDE 0.9 % IV SOLN: 500.0000 mL | INTRAVENOUS | Status: DC

## 2023-06-17 NOTE — Progress Notes (Unsigned)
GASTROENTEROLOGY PROCEDURE H&P NOTE   Primary Care Physician: Shelby Mattocks, DO  HPI: [name] is a 47 y.o. female who presents for Colonoscopy for screening.  Past Medical History:  Diagnosis Date   Complication of anesthesia    Hard to wake up   Hearing loss    Pre-diabetes    Past Surgical History:  Procedure Laterality Date   CESAREAN SECTION     HYSTEROSCOPY WITH D & C N/A 09/03/2022   Procedure: DILATATION AND CURETTAGE /HYSTEROSCOPY AND ENDOMETRIAL CURETTING;  Surgeon: Mannford Bing, MD;  Location: [facility] OR;  Service: Gynecology;  Laterality: N/A;   INTRAUTERINE DEVICE (IUD) INSERTION N/A 09/03/2022   Procedure: INTRAUTERINE DEVICE (IUD) REMOVAL AND INSERTION;  Surgeon: [place] Bing, MD;  Location: [facility] OR;  Service: Gynecology;  Laterality: N/A;   Current Outpatient Medications  Medication Sig Dispense Refill   FLUoxetine (PROZAC) 20 MG tablet Take 1 tablet (20 mg total) by mouth daily. 90 tablet 0   levonorgestrel (MIRENA) 20 MCG/DAY IUD by Intrauterine route.     methocarbamol (ROBAXIN) 500 MG tablet Take 1 tablet (500 mg total) by mouth at bedtime as needed for muscle spasms. (Patient not taking: Reported on 06/10/2023) 10 tablet 0   polyethylene glycol powder (GLYCOLAX/MIRALAX) 17 GM/SCOOP powder Take 17 g by mouth 2 (two) times daily as needed. 3350 g 1   tirzepatide (ZEPBOUND) 2.5 MG/0.5ML Pen Inject 2.5 mg into the skin once a week. (Patient not taking: Reported on 06/10/2023) 2 mL 1   Vitamin D, Ergocalciferol, (DRISDOL) 1.25 MG (50000 UNIT) CAPS capsule Take 1 capsule (50,000 Units total) by mouth every 7 (seven) days. For 8 weeks (Patient not taking: Reported on 06/10/2023) 8 capsule 0   Current Facility-Administered Medications  Medication Dose Route Frequency Provider Last Rate Last Admin   0.9 %  sodium chloride infusion  500 mL Intravenous Continuous Mansouraty, Netty Starring., MD        Current Outpatient Medications:    FLUoxetine (PROZAC) 20 MG  tablet, Take 1 tablet (20 mg total) by mouth daily., Disp: 90 tablet, Rfl: 0   levonorgestrel (MIRENA) 20 MCG/DAY IUD, by Intrauterine route., Disp: , Rfl:    methocarbamol (ROBAXIN) 500 MG tablet, Take 1 tablet (500 mg total) by mouth at bedtime as needed for muscle spasms. (Patient not taking: Reported on 06/10/2023), Disp: 10 tablet, Rfl: 0   polyethylene glycol powder (GLYCOLAX/MIRALAX) 17 GM/SCOOP powder, Take 17 g by mouth 2 (two) times daily as needed., Disp: 3350 g, Rfl: 1   tirzepatide (ZEPBOUND) 2.5 MG/0.5ML Pen, Inject 2.5 mg into the skin once a week. (Patient not taking: Reported on 06/10/2023), Disp: 2 mL, Rfl: 1   Vitamin D, Ergocalciferol, (DRISDOL) 1.25 MG (50000 UNIT) CAPS capsule, Take 1 capsule (50,000 Units total) by mouth every 7 (seven) days. For 8 weeks (Patient not taking: Reported on 06/10/2023), Disp: 8 capsule, Rfl: 0  Current Facility-Administered Medications:    0.9 %  sodium chloride infusion, 500 mL, Intravenous, Continuous, Mansouraty, Netty Starring., MD No Known Allergies Family History  Problem Relation Age of Onset   Hypertension Mother    Diabetes Mother    CAD Mother    Breast cancer Other    Colon cancer Neg Hx    Rectal cancer Neg Hx    Stomach cancer Neg Hx    Social History   Socioeconomic History   Marital status: Single    Spouse name: Not on file   Number of children: Not on file  Years of education: Not on file   Highest education level: Not on file  Occupational History   Not on file  Tobacco Use   Smoking status: Never    Passive exposure: Never   Smokeless tobacco: Never  Vaping Use   Vaping status: Never Used  Substance and Sexual Activity   Alcohol use: Not Currently    Comment: occ   Drug use: No   Sexual activity: Yes    Birth control/protection: I.U.D.  Other Topics Concern   Not on file  Social History Narrative   Not on file   Social Determinants of Health   Financial Resource Strain: Not on file  Food Insecurity:  Food Insecurity Present (11/16/2021)   Hunger Vital Sign    Worried About Running Out of Food in the Last Year: Sometimes true    Ran Out of Food in the Last Year: Sometimes true  Transportation Needs: No Transportation Needs (11/16/2021)   PRAPARE - Administrator, Civil Service (Medical): No    Lack of Transportation (Non-Medical): No  Physical Activity: Not on file  Stress: Not on file  Social Connections: Not on file  Intimate Partner Violence: Not on file    Physical Exam: Today's Vitals   06/17/23 1312  BP: (!) 149/98  Pulse: 66  Temp: 98.6 F (37 C)  SpO2: 99%  Weight: 180 lb (81.6 kg)  Height: 4\' 8"  (1.422 m)   Body mass index is 40.36 kg/m. GEN: NAD EYE: Sclerae anicteric ENT: MMM CV: Non-tachycardic GI: Soft, NT/ND NEURO:  Alert & Oriented x 3  Lab Results: No results for input(s): "WBC", "HGB", "HCT", "PLT" in the last 72 hours. BMET No results for input(s): "NA", "K", "CL", "CO2", "GLUCOSE", "BUN", "CREATININE", "CALCIUM" in the last 72 hours. LFT No results for input(s): "PROT", "ALBUMIN", "AST", "ALT", "ALKPHOS", "BILITOT", "BILIDIR", "IBILI" in the last 72 hours. PT/INR No results for input(s): "LABPROT", "INR" in the last 72 hours.   Impression / Plan: This is a 47 y.o.female who presents for Colonoscopy for screening.  The risks and benefits of endoscopic evaluation/treatment were discussed with the patient and/or family; these include but are not limited to the risk of perforation, infection, bleeding, missed lesions, lack of diagnosis, severe illness requiring hospitalization, as well as anesthesia and sedation related illnesses.  The patient's history has been reviewed, patient examined, no change in status, and deemed stable for procedure.  The patient and/or family is agreeable to proceed.    Corliss Parish, MD [facility]oscopy Office # [phone]

## 2023-06-17 NOTE — Op Note (Signed)
Endoscopy Center Patient Name: [name] Procedure Date: 06/17/2023 1:01 PM MRN: [id number] Endoscopist: Corliss Parish , MD, [id number] Age: 47 Referring MD:  Date of Birth: 1976/01/13 Gender: Female Account #: [id number] Procedure:                Colonoscopy Indications:              Screening for colorectal malignant neoplasm, This                            is the patient's first colonoscopy Medicines:                Monitored Anesthesia Care Procedure:                Pre-Anesthesia Assessment:                           - Prior to the procedure, a History and Physical                            was performed, and patient medications and                            allergies were reviewed. The patient's tolerance of                            previous anesthesia was also reviewed. The risks                            and benefits of the procedure and the sedation                            options and risks were discussed with the patient.                            All questions were answered, and informed consent                            was obtained. Prior Anticoagulants: The patient has                            taken no anticoagulant or antiplatelet agents. ASA                            Grade Assessment: II - A patient with mild systemic                            disease. After reviewing the risks and benefits,                            the patient was deemed in satisfactory condition to                            undergo the procedure.  After obtaining informed consent, the colonoscope                            was passed under direct vision. Throughout the                            procedure, the patient's blood pressure, pulse, and                            oxygen saturations were monitored continuously. The                            Olympus Scope [id number] was introduced through the                            anus and  advanced to the the cecum, identified by                            appendiceal orifice and ileocecal valve. The                            colonoscopy was somewhat difficult due to                            significant looping. Successful completion of the                            procedure was aided by changing the patient's                            position, using manual pressure, straightening and                            shortening the scope to obtain bowel loop reduction                            and using scope torsion. The patient tolerated the                            procedure. The quality of the bowel preparation was                            good. The ileocecal valve, appendiceal orifice, and                            rectum were photographed. Scope In: 1:50:35 PM Scope Out: 2:06:49 PM Scope Withdrawal Time: 0 hours 12 minutes 22 seconds  Total Procedure Duration: 0 hours 16 minutes 14 seconds  Findings:                 Skin tags were found on perianal exam.                           The digital rectal exam findings include  hemorrhoids. Pertinent negatives include no                            palpable rectal lesions.                           The colon (entire examined portion) revealed                            significantly excessive looping.                           Normal mucosa was found in the entire colon.                           Non-bleeding non-thrombosed internal hemorrhoids                            were found during retroflexion, during perianal                            exam and during digital exam. The hemorrhoids were                            Grade II (internal hemorrhoids that prolapse but                            reduce spontaneously). Complications:            No immediate complications. Estimated Blood Loss:     Estimated blood loss: none. Impression:               - Perianal skin tags found on perianal  exam.                           - Hemorrhoids found on digital rectal exam.                           - There was significant looping of the colon.                           - Normal mucosa in the entire examined colon.                           - Non-bleeding non-thrombosed internal hemorrhoids. Recommendation:           - The patient will be observed post-procedure,                            until all discharge criteria are met.                           - Discharge patient to home.                           - Patient has a contact number available for  emergencies. The signs and symptoms of potential                            delayed complications were discussed with the                            patient. Return to normal activities tomorrow.                            Written discharge instructions were provided to the                            patient.                           - High fiber diet.                           - Use FiberCon 1-2 tablets PO daily.                           - Continue present medications.                           - Repeat colonoscopy in 10 years for screening                            purposes.                           - The findings and recommendations were discussed                            with the patient.                           - The findings and recommendations were discussed                            with the designated responsible adult. Corliss Parish, MD 06/17/2023 2:10:43 PM

## 2023-06-17 NOTE — Progress Notes (Unsigned)
Vss nad trans to pacu 

## 2023-06-17 NOTE — Patient Instructions (Signed)
Resume previous diet and medications. Take Fibercon tablets 1-2 PO daily. Repeat Colonoscopy in 10 years for screening purposes.  YOU HAD AN ENDOSCOPIC PROCEDURE TODAY AT THE [facility]:   Refer to the procedure report that was given to you for any specific questions about what was found during the examination.  If the procedure report does not answer your questions, please call your gastroenterologist to clarify.  If you requested that your care partner not be given the details of your procedure findings, then the procedure report has been included in a sealed envelope for you to review at your convenience later.  YOU SHOULD EXPECT: Some feelings of bloating in the abdomen. Passage of more gas than usual.  Walking can help get rid of the air that was put into your GI tract during the procedure and reduce the bloating. If you had a lower endoscopy (such as a colonoscopy or flexible sigmoidoscopy) you may notice spotting of blood in your stool or on the toilet paper. If you underwent a bowel prep for your procedure, you may not have a normal bowel movement for a few days.  Please Note:  You might notice some irritation and congestion in your nose or some drainage.  This is from the oxygen used during your procedure.  There is no need for concern and it should clear up in a day or so.  SYMPTOMS TO REPORT IMMEDIATELY:  Following lower endoscopy (colonoscopy or flexible sigmoidoscopy):  Excessive amounts of blood in the stool  Significant tenderness or worsening of abdominal pains  Swelling of the abdomen that is new, acute  Fever of 100F or higher  For urgent or emergent issues, a gastroenterologist can be reached at any hour by calling [phone]. Do not use MyChart messaging for urgent concerns.    DIET:  We do recommend a small meal at first, but then you may proceed to your regular diet.  Drink plenty of fluids but you should avoid alcoholic beverages for 24  hours.  ACTIVITY:  You should plan to take it easy for the rest of today and you should NOT DRIVE or use heavy machinery until tomorrow (because of the sedation medicines used during the test).    FOLLOW UP: Our staff will call the number listed on your records the next business day following your procedure.  We will call around 7:15- 8:00 am to check on you and address any questions or concerns that you may have regarding the information given to you following your procedure. If we do not reach you, we will leave a message.     If any biopsies were taken you will be contacted by phone or by letter within the next 1-3 weeks.  Please call us at [phone] if you have not heard about the biopsies in 3 weeks.    SIGNATURES/CONFIDENTIALITY: You and/or your care partner have signed paperwork which will be entered into your electronic medical record.  These signatures attest to the fact that that the information above on your After Visit Summary has been reviewed and is understood.  Full responsibility of the confidentiality of this discharge information lies with you and/or your care-partner.

## 2023-06-18 ENCOUNTER — Telehealth: Payer: Self-pay

## 2023-06-18 NOTE — Telephone Encounter (Signed)
No answer, left message to call if having any issues or concerns, B.Lj Miyamoto RN 

## 2023-07-09 ENCOUNTER — Ambulatory Visit (INDEPENDENT_AMBULATORY_CARE_PROVIDER_SITE_OTHER): Payer: Self-pay | Admitting: Family Medicine

## 2023-07-09 DIAGNOSIS — T148XXA Other injury of unspecified body region, initial encounter: Secondary | ICD-10-CM

## 2023-07-12 NOTE — Progress Notes (Signed)
Not seen due to facility issue.

## 2023-07-31 ENCOUNTER — Ambulatory Visit (INDEPENDENT_AMBULATORY_CARE_PROVIDER_SITE_OTHER): Payer: Medicaid Other | Admitting: Family Medicine

## 2023-07-31 VITALS — BP 138/82 | HR 74 | Ht <= 58 in | Wt 177.8 lb

## 2023-07-31 DIAGNOSIS — R1031 Right lower quadrant pain: Secondary | ICD-10-CM

## 2023-07-31 DIAGNOSIS — M5442 Lumbago with sciatica, left side: Secondary | ICD-10-CM | POA: Diagnosis present

## 2023-07-31 MED ORDER — IBUPROFEN 600 MG PO TABS: 600.0000 mg | ORAL_TABLET | Freq: Four times a day (QID) | ORAL | 0 refills | Status: AC | PRN

## 2023-07-31 NOTE — Progress Notes (Signed)
    SUBJECTIVE:   CHIEF COMPLAINT / HPI:   LG is a 47yo F w/ hx of fibroids, obesity that p/f lower back and RLQ pain  Lower Back Pain - Since Sunday - Reports Left sideed lower back, hip pain and it goes down her L leg.  - Remembers carrying a heavy bag on Saturday. But no other preceding trauma to the area.   RLQ Pain - Has hx of fibroids. Having RLQ pain since Monday.  - Gets regular monthly light periods.  - Feels like she's about to start her period.  - Denies fevers, diarrhea.   - has been taking tylenol for pain   OBJECTIVE:   BP 138/82   Pulse 74   Ht 4\' 8"  (1.422 m)   Wt 177 lb 12.8 oz (80.6 kg)   SpO2 99%   BMI 39.86 kg/m   General: Alert, pleasant woman. NAD. HEENT: NCAT. MMM. CV: RRR, no murmurs.  Resp: CTAB, no wheezing or crackles. Normal WOB on RA.  Abm: TTP in RLQ. No rebound, guarding, or rigidity. Normal BS. Ext: Moves all ext spontaneously Skin: Warm, well perfused  Msk: TTP diffusely along R lateral lumbar spine.   ASSESSMENT/PLAN:   Assessment & Plan Acute left-sided low back pain with left-sided sciatica Suspect acute muscle strain given short duration, suspected preceding heavy lifting, and reproducibility on exam. No red flag symtpoms such as weakness.  - Discussed supportive management - ibuprofen 600mg  q6h sch for next 7 days - tylenol prn - heating pad Right lower quadrant abdominal pain Most likely fibroids related pain given location and period due to start soon. Could also be benign ruptured ovarian cyst. Less likely appendicitis or other acute abdomen given no peritoneal signs, normal PO, normal stool, and VSS. Less likely infectious such as UTI or PID given lack of fever. - NSAIDs as above - conservative management and reassurance - return precautions disucssed   Lincoln Brigham, MD [facility]

## 2023-07-31 NOTE — Patient Instructions (Signed)
Good to see you today - Thank you for coming in  Things we discussed today:  1) You most likely strained your Right lower back and it is causing your hip pain. At the same time, your fibroids are getting irritated and also causing discomfort. - Take ibuprofen 600mg  every 6 hours for the next 7 days. Afterwards, take a break. Taking ibuprofen too much can cause stomach ulcers and kidney issues. -Continue to take Tylenol as well as needed for pain -You can apply a heating pad to your stomach and lower back to help with the pain as well.  Come back in 4 weeks if the pain is not improving

## 2023-09-04 ENCOUNTER — Ambulatory Visit (INDEPENDENT_AMBULATORY_CARE_PROVIDER_SITE_OTHER): Payer: Medicaid Other | Admitting: Student

## 2023-09-04 ENCOUNTER — Encounter: Payer: Self-pay | Admitting: Student

## 2023-09-04 VITALS — BP 132/82 | HR 88 | Ht <= 58 in | Wt 179.4 lb

## 2023-09-04 DIAGNOSIS — M79675 Pain in left toe(s): Secondary | ICD-10-CM | POA: Diagnosis present

## 2023-09-04 NOTE — Patient Instructions (Addendum)
 It was great to see you today! Thank you for choosing [facility] for your primary care.  Today we addressed: Toe pain: This is improving.  You may use Tylenol  ibuprofen  as needed.  Please watch out for any further signs of infection but I suspect this will improve with time.  If you haven't already, sign up for My Chart to have easy access to your labs results, and communication with your primary care physician.  Return if symptoms worsen or fail to improve. Please arrive 15 minutes before your appointment to ensure smooth check in process.  We appreciate your efforts in making this happen.  Thank you for allowing me to participate in your care, Kieth Johnson, DO 09/04/2023, 5:03 PM PGY-3, [facility] Family Medicine

## 2023-09-04 NOTE — Progress Notes (Signed)
  SUBJECTIVE:   CHIEF COMPLAINT / HPI:   Toe pain: presents today with left 2nd toe pain which began over the weekend. She denies any trauma/injury. Notes toe appeared slightly red and inflamed. However, it started improving yesterday. She only took 1 dose of 600mg  ibuprofen . Denies fevers.  PERTINENT  PMH / PSH: Prediabetes  OBJECTIVE:  BP 132/82   Pulse 88   Ht 4' 8 (1.422 m)   Wt 179 lb 6.4 oz (81.4 kg)   SpO2 99%   BMI 40.22 kg/m  Gen: well-appearing, NAD Left foot: trace erythema to medial portion of left 2nd toe, no evidence of skin breakdown or signs of infection  ASSESSMENT/PLAN:   Assessment & Plan Pain of toe of left foot Considered trauma, infection, gout. This would be an odd location for gout especially 1st episode. No evidence of trauma or skin breakdown that would lead to infection. Unable to differentiate and symptoms are improving at this time. Recommend continuing to monitor.  Return if symptoms worsen or fail to improve. Kieth Johnson, DO 09/05/2023, 1:52 PM PGY-3, [facility]

## 2023-12-04 ENCOUNTER — Ambulatory Visit (INDEPENDENT_AMBULATORY_CARE_PROVIDER_SITE_OTHER): Admitting: Family Medicine

## 2023-12-04 ENCOUNTER — Encounter: Payer: Self-pay | Admitting: Family Medicine

## 2023-12-04 VITALS — BP 119/80 | HR 88 | Ht <= 58 in | Wt 170.8 lb

## 2023-12-04 DIAGNOSIS — L816 Other disorders of diminished melanin formation: Secondary | ICD-10-CM | POA: Diagnosis not present

## 2023-12-04 DIAGNOSIS — L209 Atopic dermatitis, unspecified: Secondary | ICD-10-CM | POA: Diagnosis not present

## 2023-12-04 LAB — POCT SKIN KOH: Skin KOH, POC: NEGATIVE

## 2023-12-04 MED ORDER — FLUOCINONIDE 0.05 % EX SOLN: 1.0000 | Freq: Every day | CUTANEOUS | 1 refills | Status: AC

## 2023-12-04 MED ORDER — HYDROCORTISONE 0.5 % EX OINT: 1.0000 | TOPICAL_OINTMENT | Freq: Every day | CUTANEOUS | 0 refills | Status: AC

## 2023-12-04 NOTE — Progress Notes (Signed)
    SUBJECTIVE:   CHIEF COMPLAINT / HPI:   Hypopigmentation of right hand- popped up maybe a few weeks ago, no trauma or burn, no vesicles, no itching or pain. Has not tried anything on it.  Scalp issues- has been present for years, can be worse than this. Has tried OTC ketoconazole shampoo/dandruff shampoo which hasn't helped. No personal history of psoriasis or eczema. Only occasionally itches. Wonders if it is a reaction to different products on her scalp. Skin will flake off. Has not tried steroid solution that she is aware of.   OBJECTIVE:   BP 119/80   Pulse 88   Ht 4\' 8"  (1.422 m)   Wt 170 lb 12.8 oz (77.5 kg)   SpO2 98%   BMI 38.29 kg/m   General: alert & oriented, no apparent distress, well groomed HEENT: normocephalic, atraumatic, EOM grossly intact, oral mucosa moist, neck supple Respiratory: normal respiratory effort GI: non-distended Skin: 2 x 2 cm hypopigmented patch with small amount of scale on right dorsal hand, on scalp thickened plaques/flaking between hair with erythema without crusting or drainage  Psych: appropriate mood and affect   ASSESSMENT/PLAN:   Assessment & Plan Skin hypopigmentation KOH skin scraping today negative Suspect atopic dermatitis with post-inflammatory hyperpigmentation, will trial low dose hydrocortisone 1% once daily for two weeks Atopic dermatitis of scalp Ddx seborrheic dermatitis thickened versus related to psoriasis, has been trying ketoconazole shampoo and going on for years Will do fluocinonide solution topical once daily, let us know if not improving in two weeks, she is also requesting dermatology referral placed today     Billey Co, MD [facility]

## 2023-12-04 NOTE — Patient Instructions (Addendum)
 It was wonderful to see you today.  Please bring ALL of your medications with you to every visit.   Today we talked about:  - For your scalp- apply a thin layer to your scalp about 1 ounce once daily, allow it to remain on your scalp for 5 minutes and then rinse thoroughly with water. If not improving in 2 weeks please let us know  - For your hand, apply the hydrocortisone ointment once daily at night to the affected area on your hand x2 weeks. If it starts becoming lighter or spreading then stop and come back in to reassess  Thank you for choosing [facility].   Please call [phone] with any questions about today's appointment.  Please arrive at least 15 minutes prior to your scheduled appointments.   If you had blood work today, I will send you a MyChart message or a letter if results are normal. Otherwise, I will give you a call.   If you had a referral placed, they will call you to set up an appointment. Please give [place] a call if you don't hear back in the next 2 weeks.   If you need additional refills before your next appointment, please call your pharmacy first.   Burley Saver, MD  Family Medicine

## 2024-01-01 ENCOUNTER — Telehealth: Payer: Self-pay | Admitting: Student

## 2024-01-01 NOTE — Telephone Encounter (Signed)
 Called patient to inform her about the Dermatology situation verbatim from what Dr. Janae Mclean stated.  Patient wasn't to pleased with the answer that was given.   Christ Courier, CMA

## 2024-01-01 NOTE — Telephone Encounter (Signed)
 Patient called stating that she had a dermatology referral placed, but the dermatologist didn't have an opening until December. Asks if we could refer her to someone that can see her sooner please.

## 2024-01-22 ENCOUNTER — Ambulatory Visit: Admitting: Family Medicine

## 2024-01-22 ENCOUNTER — Encounter: Payer: Self-pay | Admitting: Family Medicine

## 2024-01-22 VITALS — BP 145/89 | HR 80 | Ht <= 58 in | Wt 176.4 lb

## 2024-01-22 DIAGNOSIS — R7303 Prediabetes: Secondary | ICD-10-CM | POA: Diagnosis not present

## 2024-01-22 DIAGNOSIS — H6123 Impacted cerumen, bilateral: Secondary | ICD-10-CM | POA: Diagnosis not present

## 2024-01-22 DIAGNOSIS — E669 Obesity, unspecified: Secondary | ICD-10-CM | POA: Diagnosis present

## 2024-01-22 LAB — POCT GLYCOSYLATED HEMOGLOBIN (HGB A1C): Hemoglobin A1C: 5.3 % (ref 4.0–5.6)

## 2024-01-22 MED ORDER — DEBROX 6.5 % OT SOLN: 5.0000 [drp] | Freq: Two times a day (BID) | OTIC | 0 refills | Status: AC

## 2024-01-22 MED ORDER — WEGOVY 0.25 MG/0.5ML ~~LOC~~ SOAJ: 0.2500 mg | SUBCUTANEOUS | 3 refills | Status: DC

## 2024-01-22 NOTE — Assessment & Plan Note (Addendum)
 A1c in 2024 was 5.7, advised lifestyle modifications at the time. Patient was started on Zepbound  at the time as well, but was denied by her insurance. A1c improved to 5.3 today due to her committment to dietary and lifestyle changes. - Continue lifestyle modifications

## 2024-01-22 NOTE — Patient Instructions (Addendum)
 It was great to see you today! Thank you for choosing [facility] for your primary care. [name] was seen for ear cleaning.  Today we addressed: Cleaned your ears. Also prescribed you some Debrox drops to help with the impacted earwax. 5 drops of the otic liquid into both ears 2 times daily for up to 4 days if needed.  A1c rechecked today - 5.3. We started you on Wegovy today at the lowest dose. This will be injected weekly to help with your weight loss in adjunct to exercise and healthy diet.   You should return to our clinic Return in about 4 weeks (around 02/19/2024) for weight loss on wegovy. Please arrive 15 minutes before your appointment to ensure smooth check in process.  We appreciate your efforts in making this happen.  Thank you for allowing me to participate in your care, [name], DO 01/22/2024, 2:31 PM PGY-1, [facility] Family Medicine

## 2024-01-22 NOTE — Progress Notes (Signed)
   SUBJECTIVE:   CHIEF COMPLAINT / HPI:   Ear Cleaning - Thinks that her symptoms are related to her using hearing aids and due to her seasonal allergies.  Weight Loss Options - Zepbound  was prescribed previously, but PA was denied. - A1c in prediabetic range - Walks and bikes occasionally - Diet primarily consists of protein and vegetables  PERTINENT  PMH / PSH: prediabetes, uterine fibroid  OBJECTIVE:   BP (!) 145/89   Pulse 80   Ht 4\' 8"  (1.422 m)   Wt 176 lb 6.4 oz (80 kg)   SpO2 98%   BMI 39.55 kg/m   General: Awake and Alert in NAD HEENT: NCAT. Sclera anicteric. No rhinorrhea. Cerumen impaction noted bilaterally. Respiratory: Normal WOB on RA Extremities: Able to move all extremities. No BLE edema, no deformities or significant joint findings. Skin: Warm and dry. No abrasions or rashes noted. Neuro: A&Ox3. No focal neurological deficits.  ASSESSMENT/PLAN:   Assessment & Plan Obesity (BMI 35.0-39.9 without comorbidity) Diet consists of primarily vegetables, lean protein, protein shakes and occasional fried foods. She has also been using a walking pad treadmill and tried to do some weight lifting at the gym, but found the weight lifting difficult.  - Discussed aiming to do 150 minutes of activity a week (walking and biking) - VWUJWJ 0.25mg  / 0.5 mL prescribed, side effects discussed and understood - Follow up in 4 weeks to assess weight loss Prediabetes A1c in 2024 was 5.7, advised lifestyle modifications at the time. Patient was started on Zepbound  at the time as well, but was denied by her insurance. A1c improved to 5.3 today due to her committment to dietary and lifestyle changes. - Continue lifestyle modifications Impacted cerumen of both ears B/l ears were cleansed, but there was still some impaction for which Debrox was prescribed.  - Debrox 5 drops into each ear BID for ~4 days   [name], DO [facility]

## 2024-01-26 ENCOUNTER — Telehealth: Payer: Self-pay

## 2024-01-26 NOTE — Telephone Encounter (Signed)
 Patient calls nurse line in regards to Wegovy  prescription.   She reports this is needing a PA.  Will forward to pharmacy team for assistance.

## 2024-01-26 NOTE — Telephone Encounter (Signed)
 Pharmacy Patient Advocate Encounter   Received notification from Pt Calls Messages that prior authorization for WEGOVY  0.25MG  is required/requested.   Insurance verification completed.   The patient is insured through [facility] .   PA required; PA submitted to above mentioned insurance via CoverMyMeds Key/confirmation #/EOC [id number]. Status is pending

## 2024-01-27 NOTE — Telephone Encounter (Signed)
Pharmacy and patient have been updated.

## 2024-01-27 NOTE — Telephone Encounter (Signed)
 Pharmacy Patient Advocate Encounter  Received notification from [facility] MEDICAID that Prior Authorization for WEGOVY  0.25MG  has been APPROVED from 01/26/24 to 07/27/24   PA #/Case ID/Reference #: [id number]

## 2024-02-03 ENCOUNTER — Encounter: Payer: Self-pay | Admitting: *Deleted

## 2024-02-22 NOTE — Progress Notes (Deleted)
    SUBJECTIVE:   CHIEF COMPLAINT / HPI: Weight check  Started Wegovy  0.25 mg weekly on 01/22/24.   PERTINENT  PMH / PSH: prediabetes, uterine fibroid   OBJECTIVE:   There were no vitals taken for this visit.  General: Awake and Alert in NAD HEENT: NCAT. Sclera anicteric. No rhinorrhea. Cardiovascular: RRR. No M/R/G Respiratory: CTAB, normal WOB on RA. No wheezing, crackles, rhonchi, or diminished breath sounds. Abdomen: Soft, non-tender, non-distended. Bowel sounds normoactive/hypoactive/hyperactive. *** Extremities: Able to move all extremities. No BLE edema, no deformities or significant joint findings. Skin: Warm and dry. No abrasions or rashes noted. Neuro: A&Ox***. No focal neurological deficits.  ASSESSMENT/PLAN:   Assessment & Plan      Kathrine Melena, DO [facility]

## 2024-02-24 ENCOUNTER — Ambulatory Visit: Admitting: Family Medicine

## 2024-03-08 NOTE — Progress Notes (Deleted)
    SUBJECTIVE:   CHIEF COMPLAINT / HPI:   Weight Check - Started Wegovy  0.25mg  / 0.5 mL on 5/29, weight 176 lbs  PERTINENT  PMH / PSH: Prediabetes, Uterine fibroid  OBJECTIVE:   There were no vitals taken for this visit.  General: Awake and Alert in NAD HEENT: NCAT. Sclera anicteric. No rhinorrhea. Cardiovascular: RRR. No M/R/G Respiratory: CTAB, normal WOB on RA. No wheezing, crackles, rhonchi, or diminished breath sounds. Abdomen: Soft, non-tender, non-distended. Bowel sounds normoactive/hypoactive/hyperactive. *** Extremities: Able to move all extremities. No BLE edema, no deformities or significant joint findings. Skin: Warm and dry. No abrasions or rashes noted. Neuro: A&Ox***. No focal neurological deficits.  ASSESSMENT/PLAN:   Assessment & Plan      Mandy Melena, DO [facility] Center

## 2024-03-09 ENCOUNTER — Ambulatory Visit: Admitting: Family Medicine

## 2024-03-13 NOTE — Progress Notes (Deleted)
    SUBJECTIVE:   CHIEF COMPLAINT / HPI:   Weight Check - Started Wegovy  0.25mg  / 0.5 mL on 5/29, weight 176 lbs  PERTINENT  PMH / PSH: Prediabetes, Uterine fibroid  OBJECTIVE:   There were no vitals taken for this visit.  General: Awake and Alert in NAD HEENT: NCAT. Sclera anicteric. No rhinorrhea. Cardiovascular: RRR. No M/R/G Respiratory: CTAB, normal WOB on RA. No wheezing, crackles, rhonchi, or diminished breath sounds. Abdomen: Soft, non-tender, non-distended. Bowel sounds normoactive/hypoactive/hyperactive. *** Extremities: Able to move all extremities. No BLE edema, no deformities or significant joint findings. Skin: Warm and dry. No abrasions or rashes noted. Neuro: A&Ox***. No focal neurological deficits.  ASSESSMENT/PLAN:   Assessment & Plan      Kathrine Melena, DO [facility] Center

## 2024-03-15 ENCOUNTER — Ambulatory Visit: Admitting: Family Medicine

## 2024-03-17 NOTE — Progress Notes (Unsigned)
    SUBJECTIVE:   CHIEF COMPLAINT / HPI:   Weight Check - Started Wegovy  0.25mg  / 0.5 mL on 5/29, weight 176 lbs; started taking it in the beginning of July 7th. - Down 4 pounds - Exercise: walking the dog, walking treadmill  Rash - LLQ, warm, erythematous, and itchy - 1 week, gotten worse - Not at the injection site  - No new detergents, soaps, lotions - No new clothes - No recollection of insect bites - Recently moved to another apartment - No new foods or medications - Denies shaving in that area  PERTINENT  PMH / PSH: Prediabetes, Uterine fibroid  OBJECTIVE:   BP 134/79   Pulse 75   Ht 4' 8 (1.422 m)   Wt 172 lb 9.6 oz (78.3 kg)   LMP 03/15/2024   SpO2 100%   BMI 38.70 kg/m   General: Awake and Alert in NAD HEENT: NCAT. Sclera anicteric. No rhinorrhea. Cardiovascular: RRR. No M/R/G Respiratory: CTAB, normal WOB on RA. No wheezing, crackles, rhonchi, or diminished breath sounds. Abdomen: Soft, non-tender, non-distended. Bowel sounds normoactive.  LLQ rash with warmth and erythema, mildly raised, and pruritic, but no drainage noted Extremities: Able to move all extremities. No BLE edema, no deformities or significant joint findings. Skin: Warm and dry. No abrasions or rashes noted. Neuro: A&Ox 3. No focal neurological deficits.   ASSESSMENT/PLAN:   Assessment & Plan Folliculitis Rash likely related to folliculitis 2/2 heat and tight clothing.  Other differential that was considered differential includes: Fungal rash, contact dermatitis, and cellulitis.  Patient denies any fevers over the course of the last week and no scaly or flaky patches noted on exam. - KOH skin test performed but no scaly/flaky skin to detect fungal infection - Kenalog  prescribed - Discussed reasons to return to clinic including fevers, worsening rash, or drainage.  Encouraged patient to keep area dry and clean, and avoid any irritants. Encounter for weight management Started on Wegovy  at  the beginning of July.  Is doing well and has no side effects with the Wegovy .  Has lost 4 pounds in 3 weeks. - Encouraged continuing for Wegovy  at the current dose of 0.25 mg, will consider increasing as needed if weight loss seems to plateau. - Advised continuing exercise regimen and increasing as able   Kathrine Melena, DO [facility]

## 2024-03-18 ENCOUNTER — Encounter: Payer: Self-pay | Admitting: Family Medicine

## 2024-03-18 ENCOUNTER — Ambulatory Visit: Admitting: Family Medicine

## 2024-03-18 VITALS — BP 134/79 | HR 75 | Ht <= 58 in | Wt 172.6 lb

## 2024-03-18 DIAGNOSIS — Z7689 Persons encountering health services in other specified circumstances: Secondary | ICD-10-CM | POA: Diagnosis not present

## 2024-03-18 DIAGNOSIS — L739 Follicular disorder, unspecified: Secondary | ICD-10-CM | POA: Diagnosis present

## 2024-03-18 MED ORDER — TRIAMCINOLONE ACETONIDE 0.5 % EX OINT: 1.0000 | TOPICAL_OINTMENT | Freq: Two times a day (BID) | CUTANEOUS | 0 refills | Status: AC

## 2024-03-18 NOTE — Patient Instructions (Addendum)
 It was great to see you today! Thank you for choosing [facility] for your primary care. [name] was seen for heat rash/folliculitis.  Today we addressed: Heat rash/Folliculitis - apply triamcinolone  cream to the area twice a day 1-2 weeks as needed  You should return to our clinic Return if symptoms worsen or fail to improve. Please arrive 15 minutes before your appointment to ensure smooth check in process.  We appreciate your efforts in making this happen.  Thank you for allowing me to participate in your care, Kathrine Melena, DO 03/18/2024, 10:44 AM PGY-2, [facility] Family Medicine

## 2024-05-08 ENCOUNTER — Other Ambulatory Visit: Payer: Self-pay | Admitting: Family Medicine

## 2024-05-08 DIAGNOSIS — E669 Obesity, unspecified: Secondary | ICD-10-CM

## 2024-05-26 ENCOUNTER — Ambulatory Visit (INDEPENDENT_AMBULATORY_CARE_PROVIDER_SITE_OTHER): Admitting: Family Medicine

## 2024-05-26 ENCOUNTER — Encounter: Payer: Self-pay | Admitting: Family Medicine

## 2024-05-26 VITALS — BP 137/87 | HR 88 | Ht <= 58 in | Wt 172.6 lb

## 2024-05-26 DIAGNOSIS — E669 Obesity, unspecified: Secondary | ICD-10-CM

## 2024-05-26 MED ORDER — TOPIRAMATE ER 25 MG PO CAP24: 25.0000 mg | ORAL_CAPSULE | Freq: Every day | ORAL | 2 refills | Status: DC

## 2024-05-26 NOTE — Patient Instructions (Addendum)
 1) For weight loss - Start taking topiramate 25mg  once a day.  - Common side effects include brain fog. This can improve as you keep taking it but let us  know if the side effect is too much.  2) Come back in 1 month to follow-up  3) The other medications we discussed are metformin and phentermine. - Metformin can be used long term. Common side effects include GI upset. - Phentermine can only be used for about 3 months. Common side effects include high blood pressure and high heart rate

## 2024-05-26 NOTE — Progress Notes (Signed)
    SUBJECTIVE:   CHIEF COMPLAINT / HPI:   [name] is a 48yo F that pf obesity - insurance is no longer willing to cover wegovy   - She is interested in other options - She has not tried other weight loss medications in the past  PERTINENT  PMH / PSH: obesity, prediabetes  OBJECTIVE:   BP 137/87   Pulse 88   Ht 4' 8 (1.422 m)   Wt 172 lb 9.6 oz (78.3 kg)   SpO2 100%   BMI 38.70 kg/m   General: Alert, pleasant woman. NAD. HEENT: NCAT. MMM. CV: RRR, no murmurs.   Resp: CTAB, no wheezing or crackles. Normal WOB on RA.   Ext: Moves all ext spontaneously Skin: Warm, well perfused   ASSESSMENT/PLAN:   Assessment & Plan Obesity (BMI 35.0-39.9 without comorbidity) Discussed alternative options including topamax, phentermine, and metformin. Pt would like to try topamax.  - Start topiramate 25mg  daily - Advised to f/u in 1 month - If unable to tolerate, can consider metformin or phentermine    Mandy Cada, MD [facility]

## 2024-05-27 ENCOUNTER — Other Ambulatory Visit (HOSPITAL_COMMUNITY): Payer: Self-pay

## 2024-05-27 ENCOUNTER — Telehealth: Payer: Self-pay

## 2024-05-27 NOTE — Telephone Encounter (Signed)
 Prior authorization submitted for TOPIRAMATE  25MG  ER CAPSULES to [facility] MEDICAID via Latent.   Key: [id number]

## 2024-05-28 NOTE — Telephone Encounter (Signed)
 Pharmacy Patient Advocate Encounter  Received notification from [facility] MEDICAID that Prior Authorization for TOPIRAMATE 25MG  ER CAPSULES has been DENIED.  Full denial letter will be uploaded to the media tab. See denial reason below.  Per your health plan's criteria, this drug is covered if you meet the following:  (1) You have tried two preferred drugs: phentermine (tablet, capsule), phendimetrazine (tablet, extended release capsule), diethylpropion (tablet, extended release tablet).  (2) You have tried and failed Brand Trokendi XR capsules.  PA #/Case ID/Reference #: [id number]

## 2024-06-08 ENCOUNTER — Other Ambulatory Visit: Payer: Self-pay | Admitting: Family Medicine

## 2024-06-08 DIAGNOSIS — E669 Obesity, unspecified: Secondary | ICD-10-CM

## 2024-06-09 ENCOUNTER — Other Ambulatory Visit (HOSPITAL_COMMUNITY): Payer: Self-pay

## 2024-06-09 NOTE — Telephone Encounter (Signed)

## 2024-08-05 NOTE — Telephone Encounter (Signed)
 Patient returns call to nurse line regarding PA on oral weight loss medication.   Topiramate  was denied as patient has not had trial and failure of preferred drugs.   Please see preferred alteratives and send to [employer] if appropriate.   Chiquita JAYSON English, RN

## 2024-08-06 MED ORDER — PHENTERMINE HCL 15 MG PO CAPS: 15.0000 mg | ORAL_CAPSULE | ORAL | 0 refills | Status: AC

## 2024-08-06 NOTE — Addendum Note (Signed)
 Addended by: ELICIA HAMLET on: 08/06/2024 09:39 AM   Modules accepted: Orders

## 2024-10-05 ENCOUNTER — Ambulatory Visit: Payer: Self-pay | Admitting: Family Medicine
